# Patient Record
Sex: Female | Born: 1950
Health system: Southern US, Community
[De-identification: ages and names within clinical notes are randomized; demographics above are authoritative.]

## PROBLEM LIST (undated history)

## (undated) DIAGNOSIS — T7840XA Allergy, unspecified, initial encounter: Secondary | ICD-10-CM

## (undated) DIAGNOSIS — R7303 Prediabetes: Secondary | ICD-10-CM

## (undated) DIAGNOSIS — I10 Essential (primary) hypertension: Secondary | ICD-10-CM

## (undated) DIAGNOSIS — E079 Disorder of thyroid, unspecified: Secondary | ICD-10-CM

## (undated) HISTORY — DX: Allergy, unspecified, initial encounter: T78.40XA

## (undated) HISTORY — DX: Disorder of thyroid, unspecified: E07.9

## (undated) HISTORY — PX: MYOMECTOMY: SHX85

## (undated) HISTORY — PX: THYROID SURGERY: SHX805

## (undated) HISTORY — DX: Prediabetes: R73.03

## (undated) HISTORY — DX: Essential (primary) hypertension: I10

---

## 1985-10-12 HISTORY — PX: ABDOMINAL HYSTERECTOMY: SUR658

## 2000-01-21 ENCOUNTER — Encounter: Payer: Self-pay | Admitting: Internal Medicine

## 2000-01-21 ENCOUNTER — Ambulatory Visit (HOSPITAL_COMMUNITY): Admission: RE | Admit: 2000-01-21 | Discharge: 2000-01-21 | Payer: Self-pay | Admitting: Internal Medicine

## 2001-02-22 ENCOUNTER — Ambulatory Visit (HOSPITAL_COMMUNITY): Admission: RE | Admit: 2001-02-22 | Discharge: 2001-02-22 | Payer: Self-pay | Admitting: Internal Medicine

## 2001-02-22 ENCOUNTER — Encounter: Payer: Self-pay | Admitting: Internal Medicine

## 2004-10-10 ENCOUNTER — Ambulatory Visit (HOSPITAL_COMMUNITY): Admission: RE | Admit: 2004-10-10 | Discharge: 2004-10-11 | Payer: Self-pay | Admitting: General Surgery

## 2006-07-06 ENCOUNTER — Ambulatory Visit: Payer: Self-pay | Admitting: Internal Medicine

## 2006-07-20 ENCOUNTER — Ambulatory Visit: Payer: Self-pay | Admitting: Internal Medicine

## 2006-07-20 HISTORY — PX: COLONOSCOPY: SHX174

## 2011-01-26 ENCOUNTER — Encounter: Payer: Self-pay | Admitting: Internal Medicine

## 2011-01-26 DIAGNOSIS — I10 Essential (primary) hypertension: Secondary | ICD-10-CM

## 2011-01-26 DIAGNOSIS — E039 Hypothyroidism, unspecified: Secondary | ICD-10-CM | POA: Insufficient documentation

## 2013-08-16 ENCOUNTER — Ambulatory Visit (INDEPENDENT_AMBULATORY_CARE_PROVIDER_SITE_OTHER): Payer: BC Managed Care – PPO | Admitting: Family Medicine

## 2013-08-16 ENCOUNTER — Encounter: Payer: Self-pay | Admitting: Family Medicine

## 2013-08-16 VITALS — BP 146/71 | HR 80 | Ht 68.0 in | Wt 230.0 lb

## 2013-08-16 DIAGNOSIS — M6530 Trigger finger, unspecified finger: Secondary | ICD-10-CM

## 2013-08-16 DIAGNOSIS — M79609 Pain in unspecified limb: Secondary | ICD-10-CM

## 2013-08-16 DIAGNOSIS — M79644 Pain in right finger(s): Secondary | ICD-10-CM

## 2013-08-16 MED ORDER — MELOXICAM 15 MG PO TABS: 15.0000 mg | ORAL_TABLET | Freq: Every day | ORAL | Status: DC

## 2013-08-16 NOTE — Patient Instructions (Signed)
You have a trigger finger Wear splint at  Nighttime and as often as possible during the day. Meloxicam 15 mg daily with food Consider cortisone injection if not improving. Follow up with me in 6 weeks for reevaluation.

## 2013-08-17 ENCOUNTER — Encounter: Payer: Self-pay | Admitting: Family Medicine

## 2013-08-17 DIAGNOSIS — M79644 Pain in right finger(s): Secondary | ICD-10-CM | POA: Insufficient documentation

## 2013-08-17 NOTE — Progress Notes (Signed)
Patient ID: Kathryn Reed, female   DOB: 06-24-1951, 62 y.o.   MRN: [id number]  PCP: No primary provider on file.  Subjective:   HPI: Patient is a 62 y.o. female here for right middle finger pain.  Patient reports prior history of trigger fingers in the past and this feels similar. Prior ones improved with injection. Current issue been going on 1-2 months. Palmar 3rd digit pain with stiffness especially in morning, swelling. Right handed. Has not tried anything yet for this.  Past Medical History  Diagnosis Date  . Hypertension   . Thyroid disease     Current Outpatient Prescriptions on File Prior to Visit  Medication Sig Dispense Refill  . amLODipine (NORVASC) 5 MG tablet Take 5 mg by mouth daily.        Marland Kitchen levothyroxine (SYNTHROID, LEVOTHROID) 50 MCG tablet Take 50 mcg by mouth daily.        Marland Kitchen losartan-hydrochlorothiazide (HYZAAR) 100-25 MG per tablet Take 1 tablet by mouth daily.         No current facility-administered medications on file prior to visit.    Past Surgical History  Procedure Laterality Date  . Thyroid surgery    . Abdominal hysterectomy      Allergies  Allergen Reactions  . Sulfa Antibiotics   . Asa Arthritis Strength-Antacid [Aspirin Buffered]     History   Social History  . Marital Status: Married    Spouse Name: N/A    Number of Children: N/A  . Years of Education: N/A   Occupational History  . Not on file.   Social History Main Topics  . Smoking status: Former Games developer  . Smokeless tobacco: Not on file  . Alcohol Use: Not on file  . Drug Use: Not on file  . Sexual Activity: Not on file   Other Topics Concern  . Not on file   Social History Narrative  . No narrative on file    Family History  Problem Relation Age of Onset  . Heart attack Mother   . Hypertension Mother   . Heart attack Father   . Hypertension Father   . Heart attack Sister   . Diabetes Sister   . Hypertension Sister   . Hyperlipidemia Brother   . Sudden  death Neg Hx     BP 146/71  Pulse 80  Ht 5\' 8"  (1.727 m)  Wt 230 lb (104.327 kg)  BMI 34.98 kg/m2  Review of Systems: See HPI above.    Objective:  Physical Exam:  Gen: NAD  Right hand: No gross deformity, swelling, bruising, Small nodule A1 pulley 3rd digit with mild tenderness here. No other tenderness hand, 3rd digit. Collateral ligaments intact. 5/5 strength with flexion and extension 3rd MCP, PIP, DIP joints. NVI distally.    Assessment & Plan:  1. Right 3rd digit pain - consistent with flexor tendinitis vs developing trigger finger.  She would like to try meloxicam + splinting - splinted MCP at about 10 degrees with dorsal splint.  Consider injection if not improving as expected.  Icing as needed.  F/u in 6 weeks for reevaluation.

## 2013-08-17 NOTE — Assessment & Plan Note (Signed)
consistent with flexor tendinitis vs developing trigger finger.  She would like to try meloxicam + splinting - splinted MCP at about 10 degrees with dorsal splint.  Consider injection if not improving as expected.  Icing as needed.  F/u in 6 weeks for reevaluation.

## 2013-09-26 ENCOUNTER — Encounter: Payer: Self-pay | Admitting: Family Medicine

## 2013-09-26 ENCOUNTER — Encounter (INDEPENDENT_AMBULATORY_CARE_PROVIDER_SITE_OTHER): Payer: Self-pay

## 2013-09-26 ENCOUNTER — Ambulatory Visit (INDEPENDENT_AMBULATORY_CARE_PROVIDER_SITE_OTHER): Payer: BC Managed Care – PPO | Admitting: Family Medicine

## 2013-09-26 VITALS — BP 120/75 | HR 79 | Ht 68.0 in | Wt 250.0 lb

## 2013-09-26 DIAGNOSIS — M79644 Pain in right finger(s): Secondary | ICD-10-CM

## 2013-09-26 DIAGNOSIS — M79609 Pain in unspecified limb: Secondary | ICD-10-CM

## 2013-09-26 NOTE — Patient Instructions (Signed)
Your exam is much better. I don't feel a nodule here and your motion is good. This is either a resolving trigger finger now or more likely a flexor tendinitis of your third digit. Speak with your family member about occupational therapy measures. If you want to do this in a formal setting call me and we'll set this up. I wouldn't do a cortisone shot based on your exam today. Follow up with me as needed otherwise.

## 2013-09-28 ENCOUNTER — Encounter: Payer: Self-pay | Admitting: Family Medicine

## 2013-09-28 NOTE — Assessment & Plan Note (Signed)
consistent with flexor tendinitis vs developing trigger finger.  She has improved though she still has stiffness and pain.  I would not recommend injection at this time.  Meloxicam as needed.  Discussed considering occupational therapy - has a family member who does this and she will discuss with her.  Call if she wants to try formal OT.

## 2013-09-28 NOTE — Progress Notes (Signed)
Patient ID: Kathryn Reed, female   DOB: July 21, 1951, 62 y.o.   MRN: [id number]  PCP: No primary provider on file.  Subjective:   HPI: Patient is a 62 y.o. female here for right middle finger pain.  11/5: Patient reports prior history of trigger fingers in the past and this feels similar. Prior ones improved with injection. Current issue been going on 1-2 months. Palmar 3rd digit pain with stiffness especially in morning, swelling. Right handed. Has not tried anything yet for this.  12/16: Patient reports she has improved since last visit. Still having pain though in flexor portion of middle finger. No catching or locking. Some swelling. Has been splinting.  Past Medical History  Diagnosis Date  . Hypertension   . Thyroid disease     Current Outpatient Prescriptions on File Prior to Visit  Medication Sig Dispense Refill  . amLODipine (NORVASC) 5 MG tablet Take 5 mg by mouth daily.        Marland Kitchen levothyroxine (SYNTHROID, LEVOTHROID) 50 MCG tablet Take 50 mcg by mouth daily.        Marland Kitchen losartan-hydrochlorothiazide (HYZAAR) 100-25 MG per tablet Take 1 tablet by mouth daily.        . meloxicam (MOBIC) 15 MG tablet Take 1 tablet (15 mg total) by mouth daily.  30 tablet  1   No current facility-administered medications on file prior to visit.    Past Surgical History  Procedure Laterality Date  . Thyroid surgery    . Abdominal hysterectomy      Allergies  Allergen Reactions  . Sulfa Antibiotics   . Asa Arthritis Strength-Antacid [Aspirin Buffered]     History   Social History  . Marital Status: Married    Spouse Name: N/A    Number of Children: N/A  . Years of Education: N/A   Occupational History  . Not on file.   Social History Main Topics  . Smoking status: Former Games developer  . Smokeless tobacco: Not on file  . Alcohol Use: Not on file  . Drug Use: Not on file  . Sexual Activity: Not on file   Other Topics Concern  . Not on file   Social History Narrative  .  No narrative on file    Family History  Problem Relation Age of Onset  . Heart attack Mother   . Hypertension Mother   . Heart attack Father   . Hypertension Father   . Heart attack Sister   . Diabetes Sister   . Hypertension Sister   . Hyperlipidemia Brother   . Sudden death Neg Hx     BP 120/75  Pulse 79  Ht 5\' 8"  (1.727 m)  Wt 250 lb (113.399 kg)  BMI 38.02 kg/m2  Review of Systems: See HPI above.    Objective:  Physical Exam:  Gen: NAD  Right hand: No gross deformity, swelling, bruising, No nodule A1 pulley 3rd digit and no tenderness. No other tenderness hand, 3rd digit. Collateral ligaments intact. 5/5 strength with flexion and extension 3rd MCP, PIP, DIP joints. NVI distally.    Assessment & Plan:  1. Right 3rd digit pain - consistent with flexor tendinitis vs developing trigger finger.  She has improved though she still has stiffness and pain.  I would not recommend injection at this time.  Meloxicam as needed.  Discussed considering occupational therapy - has a family member who does this and she will discuss with her.  Call if she wants to try formal OT.

## 2014-07-19 DIAGNOSIS — H524 Presbyopia: Secondary | ICD-10-CM | POA: Insufficient documentation

## 2014-07-19 DIAGNOSIS — H16229 Keratoconjunctivitis sicca, not specified as Sjogren's, unspecified eye: Secondary | ICD-10-CM | POA: Insufficient documentation

## 2014-07-19 DIAGNOSIS — R87612 Low grade squamous intraepithelial lesion on cytologic smear of cervix (LGSIL): Secondary | ICD-10-CM | POA: Insufficient documentation

## 2014-07-19 DIAGNOSIS — B351 Tinea unguium: Secondary | ICD-10-CM | POA: Insufficient documentation

## 2015-12-18 ENCOUNTER — Other Ambulatory Visit: Payer: Self-pay | Admitting: General Surgery

## 2015-12-18 DIAGNOSIS — E041 Nontoxic single thyroid nodule: Secondary | ICD-10-CM

## 2015-12-24 ENCOUNTER — Other Ambulatory Visit: Payer: Self-pay

## 2015-12-24 ENCOUNTER — Ambulatory Visit
Admission: RE | Admit: 2015-12-24 | Discharge: 2015-12-24 | Disposition: A | Discharge status: Home / Self Care | Payer: Self-pay | Source: Ambulatory Visit | Attending: General Surgery | Admitting: General Surgery

## 2015-12-24 ENCOUNTER — Ambulatory Visit
Admission: RE | Admit: 2015-12-24 | Discharge: 2015-12-24 | Disposition: A | Discharge status: Home / Self Care | Payer: BLUE CROSS/BLUE SHIELD | Source: Ambulatory Visit | Attending: General Surgery | Admitting: General Surgery

## 2015-12-24 ENCOUNTER — Other Ambulatory Visit: Payer: Self-pay | Admitting: General Surgery

## 2015-12-24 ENCOUNTER — Other Ambulatory Visit (HOSPITAL_COMMUNITY)
Admission: RE | Admit: 2015-12-24 | Discharge: 2015-12-24 | Disposition: A | Discharge status: Home / Self Care | Payer: BLUE CROSS/BLUE SHIELD | Source: Ambulatory Visit | Attending: Interventional Radiology | Admitting: Interventional Radiology

## 2015-12-24 DIAGNOSIS — E041 Nontoxic single thyroid nodule: Secondary | ICD-10-CM | POA: Insufficient documentation

## 2016-01-29 ENCOUNTER — Other Ambulatory Visit: Payer: Self-pay | Admitting: General Surgery

## 2016-01-29 DIAGNOSIS — E041 Nontoxic single thyroid nodule: Secondary | ICD-10-CM

## 2016-01-31 ENCOUNTER — Ambulatory Visit
Admission: RE | Admit: 2016-01-31 | Discharge: 2016-01-31 | Disposition: A | Discharge status: Home / Self Care | Payer: BLUE CROSS/BLUE SHIELD | Source: Ambulatory Visit | Attending: General Surgery | Admitting: General Surgery

## 2016-01-31 ENCOUNTER — Other Ambulatory Visit (HOSPITAL_COMMUNITY)
Admission: RE | Admit: 2016-01-31 | Discharge: 2016-01-31 | Disposition: A | Discharge status: Home / Self Care | Payer: BLUE CROSS/BLUE SHIELD | Source: Ambulatory Visit | Attending: Interventional Radiology | Admitting: Interventional Radiology

## 2016-01-31 DIAGNOSIS — E041 Nontoxic single thyroid nodule: Secondary | ICD-10-CM | POA: Diagnosis not present

## 2016-02-13 ENCOUNTER — Ambulatory Visit: Payer: Self-pay | Admitting: General Surgery

## 2016-04-01 ENCOUNTER — Encounter (HOSPITAL_COMMUNITY): Payer: Self-pay

## 2016-04-01 NOTE — Patient Instructions (Addendum)
Kathryn Reed  04/01/2016   Your procedure is scheduled on: 04-09-16  [facility]  [facility] to 3rd floor to  Short Stay Center at 530 AM.  Call this number if you have problems the morning of surgery [phone]   Remember: ONLY 1 PERSON MAY GO WITH YOU TO SHORT STAY TO GET  READY MORNING OF YOUR SURGERY.  Do not eat food or drink liquids :After Midnight.     Take these medicines the morning of surgery with A SIP OF WATER: AMLODIPINE (NORVASC), LEVOTHYROXINE (SYNTHROID), TYLENOL IF NEEDED              You may not have any metal on your body including hair pins and              piercings  Do not wear jewelry, make-up, lotions, powders or perfumes, deodorant             Do not wear nail polish.  Do not shave  48 hours prior to surgery.              Men may shave face and neck.   Do not bring valuables to the hospital. [place] IS NOT             RESPONSIBLE   FOR VALUABLES.  Contacts, dentures or bridgework may not be worn into surgery.  Leave suitcase in the car. After surgery it may be brought to your room.                 Please read over the following fact sheets you were given: _____________________________________________________________________             [facility] - Preparing for Surgery Before surgery, you can play an important role.  Because skin is not sterile, your skin needs to be as free of germs as possible.  You can reduce the number of germs on your skin by washing with CHG (chlorahexidine gluconate) soap before surgery.  CHG is an antiseptic cleaner which kills germs and bonds with the skin to continue killing germs even after washing. Please DO NOT use if you have an allergy to CHG or antibacterial soaps.  If your skin becomes reddened/irritated stop using the CHG and inform your nurse when you arrive at Short Stay. Do not shave (including legs and underarms) for at least 48 hours prior to the  first CHG shower.  You may shave your face/neck. Please follow these instructions carefully:  1.  Shower with CHG Soap the night before surgery and the  morning of Surgery.  2.  If you choose to wash your hair, wash your hair first as usual with your  normal  shampoo.  3.  After you shampoo, rinse your hair and body thoroughly to remove the  shampoo.                           4.  Use CHG as you would any other liquid soap.  You can apply chg directly  to the skin and wash                       Gently with a scrungie or clean washcloth.  5.  Apply the CHG Soap to your body ONLY FROM THE NECK DOWN.   Do not use  on face/ open                           Wound or open sores. Avoid contact with eyes, ears mouth and genitals (private parts).                       Wash face,  Genitals (private parts) with your normal soap.             6.  Wash thoroughly, paying special attention to the area where your surgery  will be performed.  7.  Thoroughly rinse your body with warm water from the neck down.  8.  DO NOT shower/wash with your normal soap after using and rinsing off  the CHG Soap.                9.  Pat yourself dry with a clean towel.            10.  Wear clean pajamas.            11.  Place clean sheets on your bed the night of your first shower and do not  sleep with pets. Day of Surgery : Do not apply any lotions/deodorants the morning of surgery.  Please wear clean clothes to the hospital/surgery center.  FAILURE TO FOLLOW THESE INSTRUCTIONS MAY RESULT IN THE CANCELLATION OF YOUR SURGERY PATIENT SIGNATURE_________________________________  NURSE SIGNATURE__________________________________  ________________________________________________________________________

## 2016-04-03 ENCOUNTER — Encounter (HOSPITAL_COMMUNITY): Payer: Self-pay

## 2016-04-03 ENCOUNTER — Encounter (HOSPITAL_COMMUNITY)
Admission: RE | Admit: 2016-04-03 | Discharge: 2016-04-03 | Disposition: A | Discharge status: Home / Self Care | Payer: BLUE CROSS/BLUE SHIELD | Source: Ambulatory Visit | Attending: General Surgery | Admitting: General Surgery

## 2016-04-03 ENCOUNTER — Ambulatory Visit (HOSPITAL_COMMUNITY)
Admission: RE | Admit: 2016-04-03 | Discharge: 2016-04-03 | Disposition: A | Discharge status: Home / Self Care | Payer: BLUE CROSS/BLUE SHIELD | Source: Ambulatory Visit | Attending: Anesthesiology | Admitting: Anesthesiology

## 2016-04-03 DIAGNOSIS — R935 Abnormal findings on diagnostic imaging of other abdominal regions, including retroperitoneum: Secondary | ICD-10-CM | POA: Insufficient documentation

## 2016-04-03 DIAGNOSIS — Z01811 Encounter for preprocedural respiratory examination: Secondary | ICD-10-CM | POA: Diagnosis present

## 2016-04-03 DIAGNOSIS — Z01818 Encounter for other preprocedural examination: Secondary | ICD-10-CM | POA: Insufficient documentation

## 2016-04-03 LAB — CBC WITH DIFFERENTIAL/PLATELET
BASOS PCT: 0 %
Basophils Absolute: 0 10*3/uL (ref 0.0–0.1)
EOS ABS: 0.2 10*3/uL (ref 0.0–0.7)
EOS PCT: 3 %
HCT: 36.7 % (ref 36.0–46.0)
HEMOGLOBIN: 12.7 g/dL (ref 12.0–15.0)
Lymphocytes Relative: 31 %
Lymphs Abs: 2.5 10*3/uL (ref 0.7–4.0)
MCH: 30.4 pg (ref 26.0–34.0)
MCHC: 34.6 g/dL (ref 30.0–36.0)
MCV: 87.8 fL (ref 78.0–100.0)
MONOS PCT: 6 %
Monocytes Absolute: 0.5 10*3/uL (ref 0.1–1.0)
NEUTROS PCT: 60 %
Neutro Abs: 4.9 10*3/uL (ref 1.7–7.7)
PLATELETS: 368 10*3/uL (ref 150–400)
RBC: 4.18 MIL/uL (ref 3.87–5.11)
RDW: 12.8 % (ref 11.5–15.5)
WBC: 8.1 10*3/uL (ref 4.0–10.5)

## 2016-04-03 LAB — COMPREHENSIVE METABOLIC PANEL
ALBUMIN: 4.3 g/dL (ref 3.5–5.0)
ALK PHOS: 82 U/L (ref 38–126)
ALT: 14 U/L (ref 14–54)
ANION GAP: 7 (ref 5–15)
AST: 15 U/L (ref 15–41)
BUN: 14 mg/dL (ref 6–20)
CHLORIDE: 104 mmol/L (ref 101–111)
CO2: 28 mmol/L (ref 22–32)
Calcium: 9.4 mg/dL (ref 8.9–10.3)
Creatinine, Ser: 0.63 mg/dL (ref 0.44–1.00)
GFR calc non Af Amer: >60 mL/min (ref >60)
GLUCOSE: 105 mg/dL — AB (ref 65–99)
POTASSIUM: 3.3 mmol/L — AB (ref 3.5–5.1)
SODIUM: 139 mmol/L (ref 135–145)
Total Bilirubin: 0.7 mg/dL (ref 0.3–1.2)
Total Protein: 8 g/dL (ref 6.5–8.1)

## 2016-04-03 NOTE — Progress Notes (Signed)
ABNORMAL CHEST XRAY RESULTS ROUTED TO DR Corie ChiquitoOSENBOWER INBASKET BY EPIC

## 2016-04-08 NOTE — Anesthesia Preprocedure Evaluation (Addendum)
Anesthesia Evaluation  Patient identified by MRN, date of birth, ID band Patient awake    Reviewed: Allergy & Precautions, NPO status , Patient's Chart, lab work & pertinent test results  Airway Mallampati: III  TM Distance: >3 FB Neck ROM: Full    Dental  (+) Dental Advisory Given, Partial Upper, Caps   Pulmonary former smoker,    Pulmonary exam normal breath sounds clear to auscultation       Cardiovascular hypertension, Pt. on medications Normal cardiovascular exam Rhythm:Regular Rate:Normal     Neuro/Psych negative neurological ROS  negative psych ROS   GI/Hepatic negative GI ROS, Neg liver ROS,   Endo/Other  Hypothyroidism Morbid obesity  Renal/GU negative Renal ROS     Musculoskeletal negative musculoskeletal ROS (+)   Abdominal   Peds  Hematology negative hematology ROS (+)   Anesthesia Other Findings Day of surgery medications reviewed with the patient.  Reproductive/Obstetrics                           Anesthesia Physical Anesthesia Plan  ASA: III  Anesthesia Plan: General   Post-op Pain Management:    Induction: Intravenous  Airway Management Planned: Oral ETT  Additional Equipment:   Intra-op Plan:   Post-operative Plan: Extubation in OR  Informed Consent: I have reviewed the patients History and Physical, chart, labs and discussed the procedure including the risks, benefits and alternatives for the proposed anesthesia with the patient or authorized representative who has indicated his/her understanding and acceptance.   Dental advisory given  Plan Discussed with: CRNA  Anesthesia Plan Comments: (Risks/benefits of general anesthesia discussed with patient including risk of damage to teeth, lips, gum, and tongue, nausea/vomiting, allergic reactions to medications, and the possibility of heart attack, stroke and death.  All patient questions answered.  Patient wishes  to proceed.)        Anesthesia Quick Evaluation

## 2016-04-09 ENCOUNTER — Ambulatory Visit (HOSPITAL_COMMUNITY)
Admission: RE | Admit: 2016-04-09 | Discharge: 2016-04-10 | Disposition: A | Discharge status: Home / Self Care | Payer: BLUE CROSS/BLUE SHIELD | Source: Ambulatory Visit | Attending: General Surgery | Admitting: General Surgery

## 2016-04-09 ENCOUNTER — Ambulatory Visit (HOSPITAL_COMMUNITY): Payer: BLUE CROSS/BLUE SHIELD | Admitting: Anesthesiology

## 2016-04-09 ENCOUNTER — Encounter (HOSPITAL_COMMUNITY): Payer: Self-pay | Admitting: *Deleted

## 2016-04-09 ENCOUNTER — Encounter (HOSPITAL_COMMUNITY)
Admission: RE | Disposition: A | Discharge status: Home / Self Care | Payer: Self-pay | Source: Ambulatory Visit | Attending: General Surgery

## 2016-04-09 DIAGNOSIS — I10 Essential (primary) hypertension: Secondary | ICD-10-CM | POA: Diagnosis not present

## 2016-04-09 DIAGNOSIS — Z79899 Other long term (current) drug therapy: Secondary | ICD-10-CM | POA: Insufficient documentation

## 2016-04-09 DIAGNOSIS — E041 Nontoxic single thyroid nodule: Secondary | ICD-10-CM | POA: Diagnosis present

## 2016-04-09 DIAGNOSIS — E063 Autoimmune thyroiditis: Secondary | ICD-10-CM | POA: Insufficient documentation

## 2016-04-09 DIAGNOSIS — Z87891 Personal history of nicotine dependence: Secondary | ICD-10-CM | POA: Insufficient documentation

## 2016-04-09 DIAGNOSIS — R1901 Right upper quadrant abdominal swelling, mass and lump: Secondary | ICD-10-CM

## 2016-04-09 DIAGNOSIS — Z6841 Body Mass Index (BMI) 40.0 and over, adult: Secondary | ICD-10-CM | POA: Diagnosis not present

## 2016-04-09 HISTORY — PX: THYROIDECTOMY: SHX17

## 2016-04-09 LAB — BASIC METABOLIC PANEL
Anion gap: 8 (ref 5–15)
BUN: 11 mg/dL (ref 6–20)
CO2: 30 mmol/L (ref 22–32)
CREATININE: 0.71 mg/dL (ref 0.44–1.00)
Calcium: 9.1 mg/dL (ref 8.9–10.3)
Chloride: 100 mmol/L — ABNORMAL LOW (ref 101–111)
GFR calc Af Amer: >60 mL/min (ref >60)
Glucose, Bld: 140 mg/dL — ABNORMAL HIGH (ref 65–99)
POTASSIUM: 3.8 mmol/L (ref 3.5–5.1)
SODIUM: 138 mmol/L (ref 135–145)

## 2016-04-09 SURGERY — THYROIDECTOMY
Anesthesia: General | Site: Neck | Laterality: Right

## 2016-04-09 MED ORDER — ONDANSETRON 4 MG PO TBDP: 4.0000 mg | ORAL_TABLET | Freq: Four times a day (QID) | ORAL | Status: DC | PRN

## 2016-04-09 MED ORDER — LOSARTAN POTASSIUM 50 MG PO TABS
100.0000 mg | ORAL_TABLET | Freq: Every day | ORAL | Status: DC
  Administered 2016-04-09 – 2016-04-10 (×2): 100 mg via ORAL
  Filled 2016-04-09 (×2): qty 2

## 2016-04-09 MED ORDER — ONDANSETRON HCL 4 MG/2ML IJ SOLN
INTRAMUSCULAR | Status: DC | PRN
  Administered 2016-04-09: 4 mg via INTRAVENOUS

## 2016-04-09 MED ORDER — ONDANSETRON HCL 4 MG/2ML IJ SOLN
INTRAMUSCULAR | Status: AC
  Filled 2016-04-09: qty 2

## 2016-04-09 MED ORDER — LOSARTAN POTASSIUM-HCTZ 100-25 MG PO TABS: 1.0000 | ORAL_TABLET | Freq: Every day | ORAL | Status: DC

## 2016-04-09 MED ORDER — CEFAZOLIN SODIUM-DEXTROSE 2-4 GM/100ML-% IV SOLN
INTRAVENOUS | Status: AC
  Filled 2016-04-09: qty 100

## 2016-04-09 MED ORDER — CEFAZOLIN SODIUM-DEXTROSE 2-4 GM/100ML-% IV SOLN
2.0000 g | INTRAVENOUS | Status: AC
  Administered 2016-04-09: 2 g via INTRAVENOUS

## 2016-04-09 MED ORDER — HYDROCODONE-ACETAMINOPHEN 5-325 MG PO TABS
1.0000 | ORAL_TABLET | ORAL | Status: DC | PRN
  Administered 2016-04-09: 1 via ORAL
  Filled 2016-04-09: qty 1

## 2016-04-09 MED ORDER — LIDOCAINE HCL (CARDIAC) 20 MG/ML IV SOLN
INTRAVENOUS | Status: DC | PRN
  Administered 2016-04-09: 100 mg via INTRAVENOUS

## 2016-04-09 MED ORDER — SUCCINYLCHOLINE CHLORIDE 20 MG/ML IJ SOLN
INTRAMUSCULAR | Status: DC | PRN
Start: 2016-04-09 — End: 2016-04-09
  Administered 2016-04-09: 100 mg via INTRAVENOUS

## 2016-04-09 MED ORDER — MIDAZOLAM HCL 2 MG/2ML IJ SOLN
INTRAMUSCULAR | Status: AC
  Filled 2016-04-09: qty 2

## 2016-04-09 MED ORDER — ONDANSETRON HCL 4 MG/2ML IJ SOLN: 4.0000 mg | INTRAMUSCULAR | Status: DC | PRN

## 2016-04-09 MED ORDER — PROPOFOL 10 MG/ML IV BOLUS
INTRAVENOUS | Status: DC | PRN
  Administered 2016-04-09: 200 mg via INTRAVENOUS

## 2016-04-09 MED ORDER — HYDROMORPHONE HCL 1 MG/ML IJ SOLN
0.2500 mg | INTRAMUSCULAR | Status: DC | PRN
  Administered 2016-04-09 (×2): 0.5 mg via INTRAVENOUS

## 2016-04-09 MED ORDER — HYDROMORPHONE HCL 1 MG/ML IJ SOLN
INTRAMUSCULAR | Status: AC
  Filled 2016-04-09: qty 1

## 2016-04-09 MED ORDER — LACTATED RINGERS IV SOLN
INTRAVENOUS | Status: DC | PRN
  Administered 2016-04-09 (×2): via INTRAVENOUS

## 2016-04-09 MED ORDER — LIDOCAINE HCL (CARDIAC) 20 MG/ML IV SOLN
INTRAVENOUS | Status: AC
  Filled 2016-04-09: qty 5

## 2016-04-09 MED ORDER — MIDAZOLAM HCL 5 MG/5ML IJ SOLN
INTRAMUSCULAR | Status: DC | PRN
  Administered 2016-04-09: 2 mg via INTRAVENOUS

## 2016-04-09 MED ORDER — FENTANYL CITRATE (PF) 250 MCG/5ML IJ SOLN
INTRAMUSCULAR | Status: AC
Start: 2016-04-09 — End: 2016-04-09
  Filled 2016-04-09: qty 5

## 2016-04-09 MED ORDER — CEFAZOLIN SODIUM-DEXTROSE 2-4 GM/100ML-% IV SOLN
2.0000 g | Freq: Three times a day (TID) | INTRAVENOUS | Status: AC
  Administered 2016-04-09: 2 g via INTRAVENOUS
  Filled 2016-04-09: qty 100

## 2016-04-09 MED ORDER — SCOPOLAMINE 1 MG/3DAYS TD PT72
MEDICATED_PATCH | TRANSDERMAL | Status: AC
  Filled 2016-04-09: qty 1

## 2016-04-09 MED ORDER — PROPOFOL 10 MG/ML IV BOLUS
INTRAVENOUS | Status: AC
  Filled 2016-04-09: qty 40

## 2016-04-09 MED ORDER — LEVOTHYROXINE SODIUM 100 MCG PO TABS
100.0000 ug | ORAL_TABLET | Freq: Every day | ORAL | Status: DC
  Administered 2016-04-10: 100 ug via ORAL
  Filled 2016-04-09: qty 1

## 2016-04-09 MED ORDER — MORPHINE SULFATE (PF) 2 MG/ML IV SOLN: 2.0000 mg | INTRAVENOUS | Status: DC | PRN

## 2016-04-09 MED ORDER — PROMETHAZINE HCL 25 MG/ML IJ SOLN: 6.2500 mg | INTRAMUSCULAR | Status: DC | PRN

## 2016-04-09 MED ORDER — PHENYLEPHRINE HCL 10 MG/ML IJ SOLN
INTRAMUSCULAR | Status: DC | PRN
Start: 2016-04-09 — End: 2016-04-09
  Administered 2016-04-09 (×4): 80 ug via INTRAVENOUS

## 2016-04-09 MED ORDER — FENTANYL CITRATE (PF) 100 MCG/2ML IJ SOLN
INTRAMUSCULAR | Status: DC | PRN
  Administered 2016-04-09 (×2): 100 ug via INTRAVENOUS

## 2016-04-09 MED ORDER — ROCURONIUM BROMIDE 100 MG/10ML IV SOLN
INTRAVENOUS | Status: DC | PRN
  Administered 2016-04-09: 50 mg via INTRAVENOUS

## 2016-04-09 MED ORDER — SCOPOLAMINE 1 MG/3DAYS TD PT72
1.0000 | MEDICATED_PATCH | TRANSDERMAL | Status: DC
  Administered 2016-04-09: 1 via TRANSDERMAL

## 2016-04-09 MED ORDER — HYDROCHLOROTHIAZIDE 25 MG PO TABS
25.0000 mg | ORAL_TABLET | Freq: Every day | ORAL | Status: DC
Start: 2016-04-09 — End: 2016-04-10
  Administered 2016-04-09 – 2016-04-10 (×2): 25 mg via ORAL
  Filled 2016-04-09 (×2): qty 1

## 2016-04-09 MED ORDER — SUGAMMADEX SODIUM 200 MG/2ML IV SOLN
INTRAVENOUS | Status: DC | PRN
  Administered 2016-04-09: 200 mg via INTRAVENOUS

## 2016-04-09 MED ORDER — 0.9 % SODIUM CHLORIDE (POUR BTL) OPTIME
TOPICAL | Status: DC | PRN
  Administered 2016-04-09: 1000 mL

## 2016-04-09 MED ORDER — AMLODIPINE BESYLATE 5 MG PO TABS
5.0000 mg | ORAL_TABLET | Freq: Every day | ORAL | Status: DC
  Administered 2016-04-10: 5 mg via ORAL
  Filled 2016-04-09: qty 1

## 2016-04-09 SURGICAL SUPPLY — 46 items
ATTRACTOMAT 16X20 MAGNETIC DRP (DRAPES) ×2 IMPLANT
BENZOIN TINCTURE PRP APPL 2/3 (GAUZE/BANDAGES/DRESSINGS) ×2 IMPLANT
BLADE HEX COATED 2.75 (ELECTRODE) ×2 IMPLANT
BLADE SURG 15 STRL LF DISP TIS (BLADE) ×2 IMPLANT
BLADE SURG 15 STRL SS (BLADE) ×2
CLIP TI MEDIUM 6 (CLIP) ×4 IMPLANT
CLIP TI WIDE RED SMALL 6 (CLIP) ×6 IMPLANT
COVER SURGICAL LIGHT HANDLE (MISCELLANEOUS) IMPLANT
DISSECTOR ROUND CHERRY 3/8 STR (MISCELLANEOUS) ×2 IMPLANT
DRAIN PENROSE 18X1/4 LTX STRL (WOUND CARE) IMPLANT
DRAPE LAPAROTOMY T 98X78 PEDS (DRAPES) ×2 IMPLANT
DRESSING SURGICEL FIBRLLR 1X2 (HEMOSTASIS) ×1 IMPLANT
DRSG SURGICEL FIBRILLAR 1X2 (HEMOSTASIS) ×2
ELECT PENCIL ROCKER SW 15FT (MISCELLANEOUS) ×2 IMPLANT
ELECT REM PT RETURN 9FT ADLT (ELECTROSURGICAL) ×2
ELECTRODE REM PT RTRN 9FT ADLT (ELECTROSURGICAL) ×1 IMPLANT
GAUZE SPONGE 4X4 12PLY STRL (GAUZE/BANDAGES/DRESSINGS) ×2 IMPLANT
GAUZE SPONGE 4X4 16PLY XRAY LF (GAUZE/BANDAGES/DRESSINGS) ×4 IMPLANT
GLOVE BIOGEL PI IND STRL 6.5 (GLOVE) ×1 IMPLANT
GLOVE BIOGEL PI IND STRL 7.0 (GLOVE) ×1 IMPLANT
GLOVE BIOGEL PI INDICATOR 6.5 (GLOVE) ×1
GLOVE BIOGEL PI INDICATOR 7.0 (GLOVE) ×1
GLOVE ECLIPSE 8.0 STRL XLNG CF (GLOVE) ×2 IMPLANT
GLOVE INDICATOR 8.0 STRL GRN (GLOVE) ×4 IMPLANT
GLOVE SURG SS PI 6.5 STRL IVOR (GLOVE) ×2 IMPLANT
GOWN STRL REUS W/TWL LRG LVL3 (GOWN DISPOSABLE) ×4 IMPLANT
GOWN STRL REUS W/TWL XL LVL3 (GOWN DISPOSABLE) ×4 IMPLANT
HEMOSTAT SURGICEL 2X14 (HEMOSTASIS) ×2 IMPLANT
KIT BASIN OR (CUSTOM PROCEDURE TRAY) ×2 IMPLANT
NS IRRIG 1000ML POUR BTL (IV SOLUTION) ×2 IMPLANT
PACK BASIC VI WITH GOWN DISP (CUSTOM PROCEDURE TRAY) ×2 IMPLANT
SHEARS HARMONIC 9CM CVD (BLADE) IMPLANT
STAPLER VISISTAT 35W (STAPLE) ×2 IMPLANT
STRIP CLOSURE SKIN 1/2X4 (GAUZE/BANDAGES/DRESSINGS) ×2 IMPLANT
SUT MNCRL AB 4-0 PS2 18 (SUTURE) ×2 IMPLANT
SUT SILK 2 0 (SUTURE)
SUT SILK 2-0 18XBRD TIE 12 (SUTURE) IMPLANT
SUT SILK 3 0 (SUTURE)
SUT SILK 3-0 18XBRD TIE 12 (SUTURE) IMPLANT
SUT VIC AB 3-0 SH 18 (SUTURE) ×2 IMPLANT
SUT VICRYL 3 0 BR 18  UND (SUTURE)
SUT VICRYL 3 0 BR 18 UND (SUTURE) IMPLANT
SYR BULB IRRIGATION 50ML (SYRINGE) ×2 IMPLANT
TAPE CLOTH SURG 4X10 WHT LF (GAUZE/BANDAGES/DRESSINGS) ×2 IMPLANT
TOWEL OR 17X26 10 PK STRL BLUE (TOWEL DISPOSABLE) ×2 IMPLANT
YANKAUER SUCT BULB TIP 10FT TU (MISCELLANEOUS) ×2 IMPLANT

## 2016-04-09 NOTE — Discharge Instructions (Signed)
[facility]      [facility], [place] [phone]  THYROID/ PARATHYROID SURGERY: POST OP INSTRUCTIONS  Always review your discharge instruction sheet given to you by the facility where your surgery was performed.  IF YOU HAVE DISABILITY OR FAMILY LEAVE FORMS, YOU MUST BRING THEM TO THE OFFICE FOR PROCESSING.  PLEASE DO NOT GIVE THEM TO YOUR DOCTOR.  1. A prescription for pain medication may be given to you upon discharge.  Take your pain medication as prescribed, if needed.  If narcotic pain medicine is not needed, then you may take acetaminophen (Tylenol) or ibuprofen (Advil) as needed. 2. Take your usually prescribed medications unless otherwise directed. 3. If you need a refill on your pain medication, please contact your pharmacy. They will contact our office to request authorization.  Prescriptions will not be filled after 5pm or on week-ends. 4. You should follow a soft diet.  Be sure to include lots of fluids daily.  Resume your normal diet the day after surgery. 5. Most patients will experience some swelling and bruising on the chest and neck area.  Ice packs will help.  Swelling and bruising can take several days to resolve.  6. It is common to experience some constipation if taking pain medication after surgery.  Increasing fluid intake and taking a stool softener will usually help or prevent this problem from occurring.  A mild laxative (Milk of Magnesia or Miralax) should be taken according to package directions if there are no bowel movements after 48 hours. 7. Unless discharge instructions indicate otherwise, you may remove your bandages 48 hours after surgery, and you may shower at that time.  You may have steri-strips (small skin tapes) in place directly over the incision.  These strips should be left on the skin.  If your surgeon used skin glue on the incision, you may shower in 24 hours.  The glue will flake off over the next 2-3 weeks.  Any sutures or staples will be removed at the  office during your follow-up visit. 8. ACTIVITIES:  You may resume regular (light) daily activities beginning the next day--such as daily self-care, walking, climbing stairs--gradually increasing activities as tolerated.  You may have sexual intercourse when it is comfortable.  Refrain from any heavy lifting or straining for at least one week.  a. You may drive when you no longer are taking prescription pain medication, you can comfortably wear a seatbelt, and you can safely maneuver your car and apply brakes b. RETURN TO WORK:  __________________________________________________________ 9. You should see your doctor in the office for a follow-up appointment approximately two weeks after your surgery.  Make sure that you call for this appointment within a day or two after you arrive home to insure a convenient appointment time. 10. OTHER INSTRUCTIONS: ____________________________________________________________________________ _________________________________________________________________________________________________________________ _________________________________________________________________________________________________________________   WHEN TO CALL YOUR DOCTOR: 1. Fever over 101.0 2. Inability to urinate 3. Nausea and/or vomiting 4. Extreme swelling or bruising 5. Continued bleeding from incision. 6. Increased pain, redness, or drainage from the incision. 7. Difficulty swallowing or breathing 8. Muscle cramping or spasms. 9. Numbness or tingling in hands or feet or around lips.  The clinic staff is available to answer your questions during regular business hours.  Please dont hesitate to call and ask to speak to one of the nurses if you have concerns.  For further questions, please visit [email or web address]

## 2016-04-09 NOTE — Anesthesia Procedure Notes (Signed)
Procedure Name: Intubation Performed by: Kaymarie Wynn J Pre-anesthesia Checklist: Patient identified, Emergency Drugs available, Suction available, Patient being monitored and Timeout performed Patient Re-evaluated:Patient Re-evaluated prior to inductionOxygen Delivery Method: Circle system utilized Preoxygenation: Pre-oxygenation with 100% oxygen Intubation Type: IV induction Ventilation: Mask ventilation without difficulty Laryngoscope Size: Mac and 4 Grade View: Grade II Tube type: Oral Tube size: 7.0 mm Number of attempts: 1 Airway Equipment and Method: Stylet Placement Confirmation: ETT inserted through vocal cords under direct vision,  positive ETCO2,  CO2 detector and breath sounds checked- equal and bilateral Secured at: 21 cm Tube secured with: Tape Dental Injury: Teeth and Oropharynx as per pre-operative assessment        

## 2016-04-09 NOTE — Op Note (Signed)
Operative Note  [name] female 65 y.o. 04/09/2016  PREOPERATIVE DX:  Enlarging right thyroid follicular nodule  POSTOPERATIVE DX:  Same  PROCEDURE:   Completion thyroidectomy (right thyroid lobe and isthmus)         Surgeon: Adolph PollackOSENBOWER,Eluzer Howdeshell J   Assistants: Darnell Levelodd Gerkin M.D.  Anesthesia: General endotracheal anesthesia  Indications:   This is a 65 year old female who had a left thyroid lobectomy in the distant past for what turned out to be benign disease. She now has an enlarging follicular nodule despite suppressive therapy. She presents for the above operation. We have discussed the procedure, risks, and aftercare preoperatively.    Procedure Detail:  She was brought to the operating room placed supine on the operating table and a general anesthetic was given. The neck was placed in slight extension. The neck and upper chest wall were sterilely prepped and draped. A timeout was performed.  I made an incision through a previous lower transverse scar and extended it a little to the right. The subcutaneous tissue and platysma muscle were divided. Subplatysmal flaps were then raised superiorly to the laryngeal prominence and inferiorly to the suprasternal notch.  Strap muscles were identified and the median raphae divided. Right strap muscles were dissected free from the right lobe of the thyroid gland and isthmus.  The superior pole of the thyroid gland was identified. Dissection proceeded staying on the thyroid capsule.  The superior pole vessels were isolated and clipped and divided mobilizing the superior pole. Middle thyroid veins were divided close to the thyroid gland. The anterior pole was then mobilized and the inferior parathyroid gland identified and preserved. Small inferior pole vessels were clipped and divided with the Harmonic scalpel. I then used electrocautery to mobilize the isthmus. The superior parathyroid gland was isolated and preserved. The right recurrent  laryngeal nerve is identified. The thyroid gland was then dissected free from this area and rotated medially. The thyroid nodule was identified in the middle and inferior portions.  Small vessels were clipped and divided. The right lobe of the thyroid gland and then isthmus were then dissected free from the trachea. The recurrent nerve and both parathyroid glands were intact. Specimen was handed off the field and sent to pathology.  The wound was inspected and bleeding points on the trachea controlled electrocautery. Hemostasis was adequate at this time. Surgicel was placed into the wound. The strap muscles were approximated with interrupted 3-0 Vicryl sutures. The platysma muscle was approximated with interrupted 3-0 Vicryl sutures. The skin was closed with a running 4-0 Monocryl subcuticular stitch. Steri-Strips and a sterile dressing were applied.  She tolerated the procedure well without any apparent complications and was taken to the recovery room in satisfactory condition.  Estimated Blood Loss:  150 cc                 Specimens: Right lobe of thyroid gland and isthmus        Complications:  * No complications entered in OR log *         Disposition: PACU - hemodynamically stable.         Condition: stable

## 2016-04-09 NOTE — H&P (Signed)
Kathryn Reed is an 65 y.o. female.   Chief Complaint:  Here for elective surgery. HPI:  She has a enlarging follicular nodule of the right lobe of the thyroid gland despite suppressive therapy.  She has had a previous left thyroid lobectomy.  She now presents for elective right thyroid lobectomy.  Past Medical History  Diagnosis Date  . Hypertension   . Thyroid disease     Past Surgical History  Procedure Laterality Date  . Thyroid surgery  15 YRS AGO    LEFT SIDE  . Abdominal hysterectomy  1987    1 OVARY REMOVED    Family History  Problem Relation Age of Onset  . Heart attack Mother   . Hypertension Mother   . Heart attack Father   . Hypertension Father   . Heart attack Sister   . Diabetes Sister   . Hypertension Sister   . Hyperlipidemia Brother   . Sudden death Neg Hx    Social History:  reports that she quit smoking about 20 years ago. Her smoking use included Cigarettes. She has a 5 pack-year smoking history. She has never used smokeless tobacco. She reports that she does not drink alcohol or use illicit drugs.  Allergies:  Allergies  Allergen Reactions  . Sulfa Antibiotics     RASH    Medications Prior to Admission  Medication Sig Dispense Refill  . acetaminophen (TYLENOL) 325 MG tablet Take 650 mg by mouth every 6 (six) hours as needed.    Marland Kitchen. amLODipine (NORVASC) 5 MG tablet Take 5 mg by mouth daily.      . cholecalciferol (VITAMIN D) 1000 units tablet Take 3,000 Units by mouth daily.    Marland Kitchen. levothyroxine (SYNTHROID, LEVOTHROID) 50 MCG tablet Take 50 mcg by mouth daily.      Marland Kitchen. losartan-hydrochlorothiazide (HYZAAR) 100-25 MG per tablet Take 1 tablet by mouth daily.        No results found for this or any previous visit (from the past 48 hour(s)). No results found.  ROS  Pulse 85, temperature 98.2 F (36.8 C), temperature source Oral, resp. rate 18, height 5\' 7"  (1.702 m), weight 119.098 kg (262 lb 9 oz), SpO2 100 %. Physical Exam  Constitutional: No  distress.  Obese female.  HENT:  Head: Normocephalic and atraumatic.  No hoarseness  Neck:  Lower transverse scar  Palpable right lower neck mass  Cardiovascular: Normal rate and regular rhythm.   Respiratory: Effort normal and breath sounds normal.  GI: Soft. There is no tenderness.  Neurological: She is alert.  Psychiatric: She has a normal mood and affect. Her behavior is normal.     Assessment/Plan Enlarging follicular right thyroid nodule despite suppressive therapy  Plan:  Right thyroid lobectomy  Ami Thornsberry J, MD 04/09/2016, 7:16 AM

## 2016-04-09 NOTE — Anesthesia Postprocedure Evaluation (Signed)
Anesthesia Post Note  Patient: Kathryn Reed  Procedure(s) Performed: Procedure(s) (LRB): COMPLETION THYROIDECTOMY (Right)  Patient location during evaluation: PACU Anesthesia Type: General Level of consciousness: sedated Pain management: pain level controlled Vital Signs Assessment: post-procedure vital signs reviewed and stable Respiratory status: spontaneous breathing and respiratory function stable Cardiovascular status: stable Anesthetic complications: no    Last Vitals:  Filed Vitals:   04/09/16 1027 04/09/16 1133  BP: 133/69 139/65  Pulse: 68 59  Temp: 36.6 C   Resp: 15 16    Last Pain:  Filed Vitals:   04/09/16 1134  PainSc: 2                  Brynnley Dayrit DANIEL

## 2016-04-09 NOTE — Transfer of Care (Signed)
Immediate Anesthesia Transfer of Care Note  Patient: Kathryn Reed  Procedure(s) Performed: Procedure(s): COMPLETION THYROIDECTOMY (Right)  Patient Location: PACU  Anesthesia Type:General  Level of Consciousness: awake, alert  and oriented  Airway & Oxygen Therapy: Patient Spontanous Breathing and Patient connected to face mask oxygen  Post-op Assessment: Report given to RN and Post -op Vital signs reviewed and stable  Post vital signs: Reviewed and stable  Last Vitals:  Filed Vitals:   04/09/16 0541  Pulse: 85  Temp: 36.8 C  Resp: 18    Last Pain: There were no vitals filed for this visit.       Complications: No apparent anesthesia complications

## 2016-04-10 ENCOUNTER — Ambulatory Visit (HOSPITAL_COMMUNITY): Payer: BLUE CROSS/BLUE SHIELD

## 2016-04-10 DIAGNOSIS — E041 Nontoxic single thyroid nodule: Secondary | ICD-10-CM | POA: Diagnosis not present

## 2016-04-10 LAB — BASIC METABOLIC PANEL
ANION GAP: 7 (ref 5–15)
BUN: 10 mg/dL (ref 6–20)
CHLORIDE: 101 mmol/L (ref 101–111)
CO2: 29 mmol/L (ref 22–32)
CREATININE: 0.69 mg/dL (ref 0.44–1.00)
Calcium: 9.1 mg/dL (ref 8.9–10.3)
GFR calc non Af Amer: >60 mL/min (ref >60)
GLUCOSE: 139 mg/dL — AB (ref 65–99)
Potassium: 3.3 mmol/L — ABNORMAL LOW (ref 3.5–5.1)
Sodium: 137 mmol/L (ref 135–145)

## 2016-04-10 MED ORDER — LEVOTHYROXINE SODIUM 100 MCG PO TABS: 100.0000 ug | ORAL_TABLET | Freq: Every day | ORAL | Status: DC

## 2016-04-10 MED ORDER — HYDROCODONE-ACETAMINOPHEN 5-325 MG PO TABS: 1.0000 | ORAL_TABLET | ORAL | Status: DC | PRN

## 2016-04-10 NOTE — Progress Notes (Signed)
Went over d/c instructions with patient.  She verbalized understanding.  Left hospital with husband via w/c and personal vehicle with hard scripts.  Levora AngelHannah V Catilyn Boggus, RN

## 2016-04-10 NOTE — Progress Notes (Signed)
1 Day Post-Op  Subjective: Minimal pain.  Swallowing well.  Objective: Vital signs in last 24 hours: Temp:  [97.6 F (36.4 C)-98.6 F (37 C)] 98.6 F (37 C) (06/30 0656) Pulse Rate:  [59-68] 62 (06/30 0656) Resp:  [15-18] 18 (06/30 0656) BP: (133-139)/(63-69) 138/64 mmHg (06/30 0656) SpO2:  [100 %] 100 % (06/30 0656)    Intake/Output from previous day: 06/29 0701 - 06/30 0700 In: 1500 [I.V.:1500] Out: 50 [Blood:50] Intake/Output this shift: Total I/O In: 240 [P.O.:240] Out: -   PE: General- In NAD Neck-incision clean and intact with minimal swelling  Lab Results:  No results for input(s): WBC, HGB, HCT, PLT in the last 72 hours. BMET  Recent Labs  04/09/16 1718 04/10/16 0549  NA 138 137  K 3.8 3.3*  CL 100* 101  CO2 30 29  GLUCOSE 140* 139*  BUN 11 10  CREATININE 0.71 0.69  CALCIUM 9.1 9.1   PT/INR No results for input(s): LABPROT, INR in the last 72 hours. Comprehensive Metabolic Panel:    Component Value Date/Time   NA 137 04/10/2016 0549   NA 138 04/09/2016 1718   K 3.3* 04/10/2016 0549   K 3.8 04/09/2016 1718   CL 101 04/10/2016 0549   CL 100* 04/09/2016 1718   CO2 29 04/10/2016 0549   CO2 30 04/09/2016 1718   BUN 10 04/10/2016 0549   BUN 11 04/09/2016 1718   CREATININE 0.69 04/10/2016 0549   CREATININE 0.71 04/09/2016 1718   GLUCOSE 139* 04/10/2016 0549   GLUCOSE 140* 04/09/2016 1718   CALCIUM 9.1 04/10/2016 0549   CALCIUM 9.1 04/09/2016 1718   AST 15 04/03/2016 1430   ALT 14 04/03/2016 1430   ALKPHOS 82 04/03/2016 1430   BILITOT 0.7 04/03/2016 1430   PROT 8.0 04/03/2016 1430   ALBUMIN 4.3 04/03/2016 1430     Studies/Results: No results found.  Anti-infectives: Anti-infectives    Start     Dose/Rate Route Frequency Ordered Stop   04/09/16 1400  ceFAZolin (ANCEF) IVPB 2g/100 mL premix     2 g 200 mL/hr over 30 Minutes Intravenous Every 8 hours 04/09/16 1020 04/09/16 1614   04/09/16 0542  ceFAZolin (ANCEF) IVPB 2g/100 mL premix      2 g 200 mL/hr over 30 Minutes Intravenous On call to O.R. 04/09/16 0542 04/09/16 0746      Assessment Active Problems:  Enlarging follicular nodule right thyroid lobe s/p completion thyroidectomy 04/09/16-doing well; Calcium level normal and stable  RUQ abdominal opacity on CXR-she has no RUQ pain; plain abdominal x-rays recommended by Radiologist for further evaluation.      Plan: Abdominal x-rays today.  Further evaluation as outpatient if needed based on x-ray results.  Discharge today.  Instructions given to her.   Raesha Coonrod J 04/10/2016

## 2016-05-18 ENCOUNTER — Encounter: Payer: Self-pay | Admitting: Gastroenterology

## 2016-08-31 ENCOUNTER — Encounter: Payer: Self-pay | Admitting: Gastroenterology

## 2016-09-22 ENCOUNTER — Ambulatory Visit (AMBULATORY_SURGERY_CENTER): Payer: Self-pay | Admitting: *Deleted

## 2016-09-22 VITALS — Ht 67.0 in | Wt 264.0 lb

## 2016-09-22 DIAGNOSIS — Z1211 Encounter for screening for malignant neoplasm of colon: Secondary | ICD-10-CM

## 2016-09-22 MED ORDER — NA SULFATE-K SULFATE-MG SULF 17.5-3.13-1.6 GM/177ML PO SOLN: 1.0000 | Freq: Once | ORAL | 0 refills | Status: AC

## 2016-09-22 NOTE — Progress Notes (Signed)
No egg or soy allergy known to patient  No issues with past sedation with any surgeries  or procedures, no intubation problems  No diet pills per patient No home 02 use per patient  No blood thinners per patient  Pt denies issues with constipation  No A fib or A flutter  emmi video to e mail    

## 2016-09-23 ENCOUNTER — Encounter: Payer: Self-pay | Admitting: Gastroenterology

## 2016-10-01 ENCOUNTER — Telehealth: Payer: Self-pay | Admitting: Gastroenterology

## 2016-10-01 NOTE — Telephone Encounter (Signed)
Patient calling back regarding this. She is requesting a callback on her cell phone

## 2016-10-02 NOTE — Telephone Encounter (Signed)
We have not received any paperwork from pharmacy, Called patient and offered her a free Suprep kit she will be here today before 5 to pick it up

## 2016-10-07 ENCOUNTER — Ambulatory Visit (AMBULATORY_SURGERY_CENTER): Payer: BLUE CROSS/BLUE SHIELD | Admitting: Gastroenterology

## 2016-10-07 ENCOUNTER — Encounter: Payer: Self-pay | Admitting: Gastroenterology

## 2016-10-07 VITALS — BP 143/73 | HR 69 | Temp 97.7°F | Resp 14 | Ht 67.0 in | Wt 264.0 lb

## 2016-10-07 DIAGNOSIS — Z1212 Encounter for screening for malignant neoplasm of rectum: Secondary | ICD-10-CM

## 2016-10-07 DIAGNOSIS — Z1211 Encounter for screening for malignant neoplasm of colon: Secondary | ICD-10-CM

## 2016-10-07 MED ORDER — SODIUM CHLORIDE 0.9 % IV SOLN: 500.0000 mL | INTRAVENOUS | Status: AC

## 2016-10-07 NOTE — Patient Instructions (Signed)
YOU HAD AN ENDOSCOPIC PROCEDURE TODAY AT [facility]:   Refer to the procedure report that was given to you for any specific questions about what was found during the examination.  If the procedure report does not answer your questions, please call your gastroenterologist to clarify.  If you requested that your care partner not be given the details of your procedure findings, then the procedure report has been included in a sealed envelope for you to review at your convenience later.  YOU SHOULD EXPECT: Some feelings of bloating in the abdomen. Passage of more gas than usual.  Walking can help get rid of the air that was put into your GI tract during the procedure and reduce the bloating. If you had a lower endoscopy (such as a colonoscopy or flexible sigmoidoscopy) you may notice spotting of blood in your stool or on the toilet paper. If you underwent a bowel prep for your procedure, you may not have a normal bowel movement for a few days.  Please Note:  You might notice some irritation and congestion in your nose or some drainage.  This is from the oxygen used during your procedure.  There is no need for concern and it should clear up in a day or so.  SYMPTOMS TO REPORT IMMEDIATELY:   Following lower endoscopy (colonoscopy or flexible sigmoidoscopy):  Excessive amounts of blood in the stool  Significant tenderness or worsening of abdominal pains  Swelling of the abdomen that is new, acute  Fever of 100F or higher   Following upper endoscopy (EGD)  Vomiting of blood or coffee ground material  New chest pain or pain under the shoulder blades  Painful or persistently difficult swallowing  New shortness of breath  Fever of 100F or higher  Black, tarry-looking stools  For urgent or emergent issues, a gastroenterologist can be reached at any hour by calling [phone].   DIET:  We do recommend a small meal at first, but then you may proceed to your regular diet.  Drink  plenty of fluids but you should avoid alcoholic beverages for 24 hours.  ACTIVITY:  You should plan to take it easy for the rest of today and you should NOT DRIVE or use heavy machinery until tomorrow (because of the sedation medicines used during the test).    FOLLOW UP: Our staff will call the number listed on your records the next business day following your procedure to check on you and address any questions or concerns that you may have regarding the information given to you following your procedure. If we do not reach you, we will leave a message.  However, if you are feeling well and you are not experiencing any problems, there is no need to return our call.  We will assume that you have returned to your regular daily activities without incident.  If any biopsies were taken you will be contacted by phone or by letter within the next 1-3 weeks.  Please call us at [phone] if you have not heard about the biopsies in 3 weeks.    SIGNATURES/CONFIDENTIALITY: You and/or your care partner have signed paperwork which will be entered into your electronic medical record.  These signatures attest to the fact that that the information above on your After Visit Summary has been reviewed and is understood.  Full responsibility of the confidentiality of this discharge information lies with you and/or your care-partner.    Handouts were given to your care partner on diverticulosis  and hemorrhoids. You may resume your current medications today. Repeat colonoscopy in 10 years for screening purposes. Please call if any questions or concerns.

## 2016-10-07 NOTE — Progress Notes (Signed)
No problems noted in the recovery room. maw 

## 2016-10-07 NOTE — Op Note (Signed)
[facility] Patient Name: Kathryn Reed Procedure Date: 10/07/2016 2:19 PM MRN: [id number] Endoscopist: Napoleon Form , MD Age: 65 Referring MD:  Date of Birth: [birth year] Gender: Female Account #: [id number] Procedure:                Colonoscopy Indications:              Screening for colorectal malignant neoplasm, Last                            colonoscopy: 2007 Medicines:                Monitored Anesthesia Care Procedure:                Pre-Anesthesia Assessment:                           - Prior to the procedure, a History and Physical                            was performed, and patient medications and                            allergies were reviewed. The patient's tolerance of                            previous anesthesia was also reviewed. The risks                            and benefits of the procedure and the sedation                            options and risks were discussed with the patient.                            All questions were answered, and informed consent                            was obtained. Prior Anticoagulants: The patient has                            taken no previous anticoagulant or antiplatelet                            agents. ASA Grade Assessment: II - A patient with                            mild systemic disease. After reviewing the risks                            and benefits, the patient was deemed in                            satisfactory condition to undergo the procedure.  After obtaining informed consent, the colonoscope                            was passed under direct vision. Throughout the                            procedure, the patient's blood pressure, pulse, and                            oxygen saturations were monitored continuously. The                            Model CF-HQ190L [id number](SN#[id number]) scope was introduced                            through the anus and advanced to the  the cecum,                            identified by appendiceal orifice and ileocecal                            valve. The colonoscopy was performed without                            difficulty. The patient tolerated the procedure                            well. The quality of the bowel preparation was                            excellent. The ileocecal valve, appendiceal                            orifice, and rectum were photographed. Scope In: 2:37:50 PM Scope Out: 2:57:41 PM Scope Withdrawal Time: 0 hours 7 minutes 48 seconds  Total Procedure Duration: 0 hours 19 minutes 51 seconds  Findings:                 The perianal and digital rectal examinations were                            normal.                           A few small-mouthed diverticula were found in the                            sigmoid colon and descending colon.                           Non-bleeding internal hemorrhoids were found during                            retroflexion. The hemorrhoids were small.  The exam was otherwise without abnormality. Complications:            No immediate complications. Estimated Blood Loss:     Estimated blood loss: none. Impression:               - Diverticulosis in the sigmoid colon and in the                            descending colon.                           - Non-bleeding internal hemorrhoids.                           - The examination was otherwise normal.                           - No specimens collected. Recommendation:           - Patient has a contact number available for                            emergencies. The signs and symptoms of potential                            delayed complications were discussed with the                            patient. Return to normal activities tomorrow.                            Written discharge instructions were provided to the                            patient.                           - Resume  previous diet.                           - Continue present medications.                           - Repeat colonoscopy in 10 years for screening                            purposes.                           - Return to GI clinic PRN. Napoleon FormKavitha V. Nandigam, MD 10/07/2016 3:04:54 PM This report has been signed electronically.

## 2016-10-07 NOTE — Progress Notes (Signed)
To PACU  Awake and alert, Report to RN 

## 2016-10-08 ENCOUNTER — Telehealth: Payer: Self-pay | Admitting: *Deleted

## 2016-10-08 NOTE — Telephone Encounter (Signed)
  Follow up Call-  Call back number 10/07/2016  Post procedure Call Back phone  # [phone]  Permission to leave phone message Yes  Some recent data might be hidden     Patient questions:  Do you have a fever, pain , or abdominal swelling? No. Pain Score  0 *  Have you tolerated food without any problems? Yes.    Have you been able to return to your normal activities? Yes.    Do you have any questions about your discharge instructions: Diet   No. Medications  No. Follow up visit  No.  Do you have questions or concerns about your Care? No.  Actions: * If pain score is 4 or above: No action needed, pain <4.

## 2016-11-27 DIAGNOSIS — H2513 Age-related nuclear cataract, bilateral: Secondary | ICD-10-CM | POA: Insufficient documentation

## 2017-05-28 DIAGNOSIS — H25013 Cortical age-related cataract, bilateral: Secondary | ICD-10-CM | POA: Insufficient documentation

## 2017-05-28 DIAGNOSIS — H43813 Vitreous degeneration, bilateral: Secondary | ICD-10-CM | POA: Insufficient documentation

## 2017-05-28 DIAGNOSIS — H5203 Hypermetropia, bilateral: Secondary | ICD-10-CM | POA: Insufficient documentation

## 2017-05-28 DIAGNOSIS — E119 Type 2 diabetes mellitus without complications: Secondary | ICD-10-CM | POA: Insufficient documentation

## 2017-06-25 DIAGNOSIS — Z1231 Encounter for screening mammogram for malignant neoplasm of breast: Secondary | ICD-10-CM | POA: Diagnosis not present

## 2017-08-06 DIAGNOSIS — Z87891 Personal history of nicotine dependence: Secondary | ICD-10-CM | POA: Diagnosis not present

## 2017-08-06 DIAGNOSIS — Z1272 Encounter for screening for malignant neoplasm of vagina: Secondary | ICD-10-CM | POA: Diagnosis not present

## 2017-08-06 DIAGNOSIS — Z90710 Acquired absence of both cervix and uterus: Secondary | ICD-10-CM | POA: Diagnosis not present

## 2017-08-06 DIAGNOSIS — Z01419 Encounter for gynecological examination (general) (routine) without abnormal findings: Secondary | ICD-10-CM | POA: Diagnosis not present

## 2017-08-23 ENCOUNTER — Encounter: Payer: Self-pay | Admitting: Family Medicine

## 2017-08-23 ENCOUNTER — Ambulatory Visit (INDEPENDENT_AMBULATORY_CARE_PROVIDER_SITE_OTHER): Payer: Medicare Other | Admitting: Family Medicine

## 2017-08-23 DIAGNOSIS — M25561 Pain in right knee: Secondary | ICD-10-CM

## 2017-08-23 NOTE — Progress Notes (Signed)
PCP: Laurena Slimmerlark, Preston S, MD  Subjective:   HPI: Patient is a 66 y.o. female here for right knee pain.  Patient denies known injury or trauma. She states pain comes and goes in right knee anteriorly past 2 1/2 weeks. She recalls getting something out of a lazy susan but also did not have pain with this. Has tried ibuprofen, icing. Had pain posteriorly last week. Pain level now 0/10, has been dull and deep. No skin changes, numbness.  Past Medical History:  Diagnosis Date  . Allergy   . Hypertension   . Pre-diabetes    diet controlled  . Thyroid disease     Current Outpatient Medications on File Prior to Visit  Medication Sig Dispense Refill  . conjugated estrogens (PREMARIN) vaginal cream Insert 1/4 applicator vaginally q HS 2wk prior to appt.  Stop using it 2d prior to appt.    Marland Kitchen. acetaminophen (TYLENOL) 325 MG tablet Take 650 mg by mouth every 6 (six) hours as needed.    Marland Kitchen. amLODipine (NORVASC) 5 MG tablet Take 5 mg by mouth daily.      . cholecalciferol (VITAMIN D) 1000 units tablet Take 3,000 Units by mouth daily.    . fluticasone (FLONASE) 50 MCG/ACT nasal spray 2 sprays. PRN    . levothyroxine (SYNTHROID) 125 MCG tablet Take 125 mcg by mouth daily before breakfast.    . losartan-hydrochlorothiazide (HYZAAR) 100-25 MG per tablet Take 1 tablet by mouth daily.       Current Facility-Administered Medications on File Prior to Visit  Medication Dose Route Frequency Provider Last Rate Last Dose  . 0.9 %  sodium chloride infusion  500 mL Intravenous Continuous Nandigam, Eleonore ChiquitoKavitha V, MD        Past Surgical History:  Procedure Laterality Date  . ABDOMINAL HYSTERECTOMY  1987   1 OVARY REMOVED  . COLONOSCOPY  07/20/2006   DB  . MYOMECTOMY     30+ yrs ago   . THYROID SURGERY  15 YRS AGO   LEFT SIDE    Allergies  Allergen Reactions  . Sulfa Antibiotics     RASH  . Metformin Other (See Comments)    Social History   Socioeconomic History  . Marital status: Married     Spouse name: Not on file  . Number of children: Not on file  . Years of education: Not on file  . Highest education level: Not on file  Social Needs  . Financial resource strain: Not on file  . Food insecurity - worry: Not on file  . Food insecurity - inability: Not on file  . Transportation needs - medical: Not on file  . Transportation needs - non-medical: Not on file  Occupational History  . Not on file  Tobacco Use  . Smoking status: Former Smoker    Packs/day: 0.50    Years: 10.00    Pack years: 5.00    Types: Cigarettes    Last attempt to quit: 10/13/1995    Years since quitting: 21.8  . Smokeless tobacco: Never Used  Substance and Sexual Activity  . Alcohol use: No  . Drug use: No  . Sexual activity: Not on file  Other Topics Concern  . Not on file  Social History Narrative  . Not on file    Family History  Problem Relation Age of Onset  . Heart attack Mother   . Hypertension Mother   . Heart attack Father   . Hypertension Father   . Heart attack Sister   .  Diabetes Sister   . Hypertension Sister   . Hyperlipidemia Brother   . Sudden death Neg Hx   . Colon cancer Neg Hx   . Colon polyps Neg Hx   . Esophageal cancer Neg Hx   . Rectal cancer Neg Hx   . Stomach cancer Neg Hx   . Breast cancer Neg Hx     BP 121/73   Pulse 77   Ht 5\' 7"  (1.702 m)   Wt 265 lb (120.2 kg)   BMI 41.50 kg/m   Review of Systems: See HPI above.     Objective:  Physical Exam:  Gen: NAD, comfortable in exam room  Right knee: Mild-mod effusion.  No other gross deformity, ecchymoses. TTP medial and lateral joint lines.  No other tenderness. FROM. Negative ant/post drawers. Negative valgus/varus testing. Negative lachmanns. Negative mcmurrays, apleys, patellar apprehension. NV intact distally.  Left knee: No gross deformity, ecchymoses, swelling. No TTP. FROM with full strength. Negative ant/post drawers. Negative valgus/varus testing. NV intact distally.    Assessment & Plan:  1. Right knee pain - consistent with arthritis.  Discussed tylenol, topical medications, supplements, aleve/ibuprofen.  Consider injection.  Shown home exercises to do daily.  Consider physical therapy.  Icing, elevation, compression.  F/u in 6 weeks.

## 2017-08-23 NOTE — Patient Instructions (Signed)
Your pain and knee swelling are due to arthritis. These are the different medications you can take for this: Tylenol 500mg  1-2 tabs three times a day for pain. Capsaicin, aspercreme, or biofreeze topically up to four times a day may also help with pain. Some supplements that may help for arthritis: Boswellia extract, curcumin, pycnogenol Aleve 1-2 tabs twice a day with food OR ibuprofen 600mg  three times a day with food for pain and inflammation - typically take for 7-10 days then as needed. Cortisone injections are an option. If cortisone injections do not help, there are different types of shots that may help but they take longer to take effect. It's important that you continue to stay active. Straight leg raises, knee extensions 3 sets of 10 once a day (add ankle weight if these become too easy). Consider physical therapy to strengthen muscles around the joint that hurts to take pressure off of the joint itself. Shoe inserts with good arch support may be helpful. Ice 15 minutes at a time 3-4 times a day as needed to help with pain and swelling. Compression sleeve during the day to keep swelling down. Elevation as much as possible also. Water aerobics and cycling with low resistance are the best two types of exercise for arthritis though any exercise is ok as long as it doesn't worsen the pain. Follow up with me in 6 weeks (sooner if you're not doing well and/or want to try the injection).

## 2017-08-23 NOTE — Assessment & Plan Note (Signed)
consistent with arthritis.  Discussed tylenol, topical medications, supplements, aleve/ibuprofen.  Consider injection.  Shown home exercises to do daily.  Consider physical therapy.  Icing, elevation, compression.  F/u in 6 weeks.

## 2017-09-15 DIAGNOSIS — I10 Essential (primary) hypertension: Secondary | ICD-10-CM | POA: Diagnosis not present

## 2017-09-15 DIAGNOSIS — E119 Type 2 diabetes mellitus without complications: Secondary | ICD-10-CM | POA: Diagnosis not present

## 2017-09-15 DIAGNOSIS — M150 Primary generalized (osteo)arthritis: Secondary | ICD-10-CM | POA: Diagnosis not present

## 2017-09-15 DIAGNOSIS — E039 Hypothyroidism, unspecified: Secondary | ICD-10-CM | POA: Diagnosis not present

## 2017-09-15 DIAGNOSIS — E7800 Pure hypercholesterolemia, unspecified: Secondary | ICD-10-CM | POA: Diagnosis not present

## 2017-09-15 DIAGNOSIS — R7302 Impaired glucose tolerance (oral): Secondary | ICD-10-CM | POA: Diagnosis not present

## 2017-09-15 DIAGNOSIS — Z13220 Encounter for screening for lipoid disorders: Secondary | ICD-10-CM | POA: Diagnosis not present

## 2017-09-27 ENCOUNTER — Encounter: Payer: Self-pay | Admitting: Family Medicine

## 2017-09-27 ENCOUNTER — Ambulatory Visit (INDEPENDENT_AMBULATORY_CARE_PROVIDER_SITE_OTHER): Payer: Medicare Other | Admitting: Family Medicine

## 2017-09-27 DIAGNOSIS — M25561 Pain in right knee: Secondary | ICD-10-CM

## 2017-09-27 NOTE — Assessment & Plan Note (Signed)
2/2 arthritis.  Clinically improving.  Icing, tylenol or ibuprofen only if needed.  Continue home exercises for another 6 weeks.  F/u prn.

## 2017-09-27 NOTE — Progress Notes (Signed)
PCP: Laurena Slimmerlark, Preston S, MD  Subjective:   HPI: Patient is a 66 y.o. female here for right knee pain.  11/12: Patient denies known injury or trauma. She states pain comes and goes in right knee anteriorly past 2 1/2 weeks. She recalls getting something out of a lazy susan but also did not have pain with this. Has tried ibuprofen, icing. Had pain posteriorly last week. Pain level now 0/10, has been dull and deep. No skin changes, numbness.  12/17: Patient reports she's doing well. Pain level is 0/10 currently. Feels about 80% improved compared to last visit. Icing when needed. Doing some motion exercises, walking. Not requiring medication for pain. No skin changes.  Past Medical History:  Diagnosis Date  . Allergy   . Hypertension   . Pre-diabetes    diet controlled  . Thyroid disease     Current Outpatient Medications on File Prior to Visit  Medication Sig Dispense Refill  . acetaminophen (TYLENOL) 325 MG tablet Take 650 mg by mouth every 6 (six) hours as needed.    Marland Kitchen. amLODipine (NORVASC) 5 MG tablet Take 5 mg by mouth daily.      . cholecalciferol (VITAMIN D) 1000 units tablet Take 3,000 Units by mouth daily.    Marland Kitchen. conjugated estrogens (PREMARIN) vaginal cream Insert 1/4 applicator vaginally q HS 2wk prior to appt.  Stop using it 2d prior to appt.    . fluticasone (FLONASE) 50 MCG/ACT nasal spray 2 sprays. PRN    . levothyroxine (SYNTHROID) 125 MCG tablet Take 125 mcg by mouth daily before breakfast.    . losartan-hydrochlorothiazide (HYZAAR) 100-25 MG per tablet Take 1 tablet by mouth daily.       Current Facility-Administered Medications on File Prior to Visit  Medication Dose Route Frequency Provider Last Rate Last Dose  . 0.9 %  sodium chloride infusion  500 mL Intravenous Continuous Nandigam, Eleonore ChiquitoKavitha V, MD        Past Surgical History:  Procedure Laterality Date  . ABDOMINAL HYSTERECTOMY  1987   1 OVARY REMOVED  . COLONOSCOPY  07/20/2006   DB  . MYOMECTOMY      30+ yrs ago   . THYROID SURGERY  15 YRS AGO   LEFT SIDE  . THYROIDECTOMY Right 04/09/2016   Procedure: COMPLETION THYROIDECTOMY;  Surgeon: Avel Peaceodd Rosenbower, MD;  Location: [facility];  Service: General;  Laterality: Right;    Allergies  Allergen Reactions  . Sulfa Antibiotics     RASH  . Metformin Other (See Comments)    Social History   Socioeconomic History  . Marital status: Married    Spouse name: Not on file  . Number of children: Not on file  . Years of education: Not on file  . Highest education level: Not on file  Social Needs  . Financial resource strain: Not on file  . Food insecurity - worry: Not on file  . Food insecurity - inability: Not on file  . Transportation needs - medical: Not on file  . Transportation needs - non-medical: Not on file  Occupational History  . Not on file  Tobacco Use  . Smoking status: Former Smoker    Packs/day: 0.50    Years: 10.00    Pack years: 5.00    Types: Cigarettes    Last attempt to quit: 10/13/1995    Years since quitting: 21.9  . Smokeless tobacco: Never Used  Substance and Sexual Activity  . Alcohol use: No  . Drug use: No  .  Sexual activity: Not on file  Other Topics Concern  . Not on file  Social History Narrative  . Not on file    Family History  Problem Relation Age of Onset  . Heart attack Mother   . Hypertension Mother   . Heart attack Father   . Hypertension Father   . Heart attack Sister   . Diabetes Sister   . Hypertension Sister   . Hyperlipidemia Brother   . Sudden death Neg Hx   . Colon cancer Neg Hx   . Colon polyps Neg Hx   . Esophageal cancer Neg Hx   . Rectal cancer Neg Hx   . Stomach cancer Neg Hx   . Breast cancer Neg Hx     BP 118/75   Pulse 76   Ht 5\' 7"  (1.702 m)   Wt 267 lb (121.1 kg)   BMI 41.82 kg/m   Review of Systems: See HPI above.     Objective:  Physical Exam:  Gen: NAD, comfortable in exam room.  Right knee: Mild effusion.  No other gross deformity,  ecchymoses. No TTP. FROM with 5/5 strength. Negative ant/post drawers. Negative valgus/varus testing. Negative lachmanns. Negative mcmurrays, apleys, patellar apprehension. NV intact distally.   Assessment & Plan:  1. Right knee pain - 2/2 arthritis.  Clinically improving.  Icing, tylenol or ibuprofen only if needed.  Continue home exercises for another 6 weeks.  F/u prn.

## 2017-09-27 NOTE — Patient Instructions (Signed)
You're doing great! Ice the knee if needed as you have been. Do home exercises for another 6 weeks then you should be able to stop these. Follow up with me as needed.

## 2017-12-29 DIAGNOSIS — E039 Hypothyroidism, unspecified: Secondary | ICD-10-CM | POA: Diagnosis not present

## 2017-12-29 DIAGNOSIS — I10 Essential (primary) hypertension: Secondary | ICD-10-CM | POA: Diagnosis not present

## 2017-12-29 DIAGNOSIS — E119 Type 2 diabetes mellitus without complications: Secondary | ICD-10-CM | POA: Diagnosis not present

## 2017-12-29 DIAGNOSIS — Z23 Encounter for immunization: Secondary | ICD-10-CM | POA: Diagnosis not present

## 2017-12-29 DIAGNOSIS — E663 Overweight: Secondary | ICD-10-CM | POA: Diagnosis not present

## 2017-12-29 DIAGNOSIS — E7800 Pure hypercholesterolemia, unspecified: Secondary | ICD-10-CM | POA: Diagnosis not present

## 2017-12-29 DIAGNOSIS — Z6841 Body Mass Index (BMI) 40.0 and over, adult: Secondary | ICD-10-CM | POA: Diagnosis not present

## 2018-02-13 IMAGING — US US THYROID BIOPSY
1 series · 12 of 12 positions shown · non-contrast
Comparison: 09/11/2015

INDICATION: 64-year-old female with a history of prior left-sided thyroidectomy.

[Series 1: us thyroid biopsy · 0.07mm/px · 12 acquisitions, 12 frames shown]
[im 1/12]
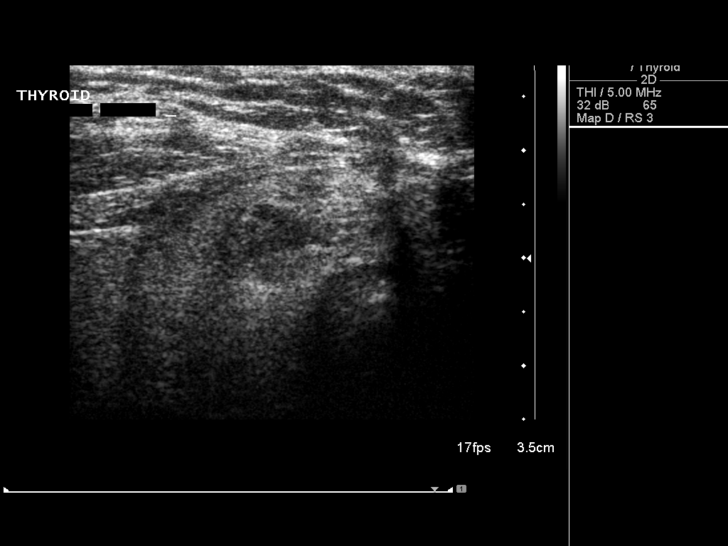
[im 2/12]
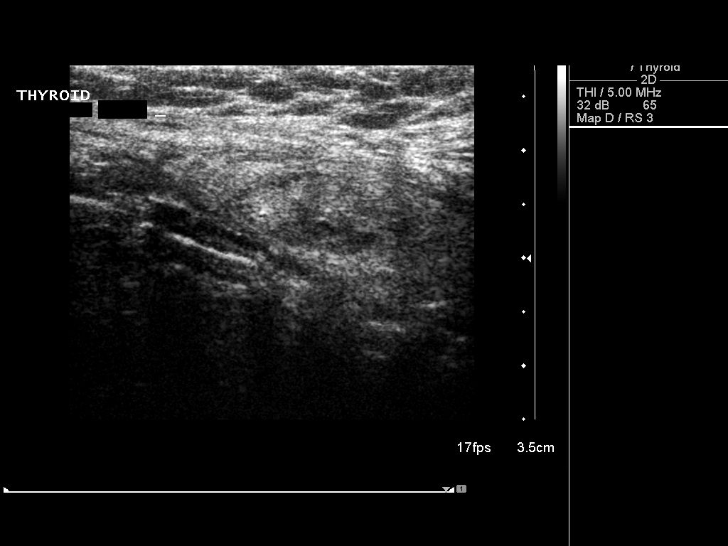
[im 3/12]
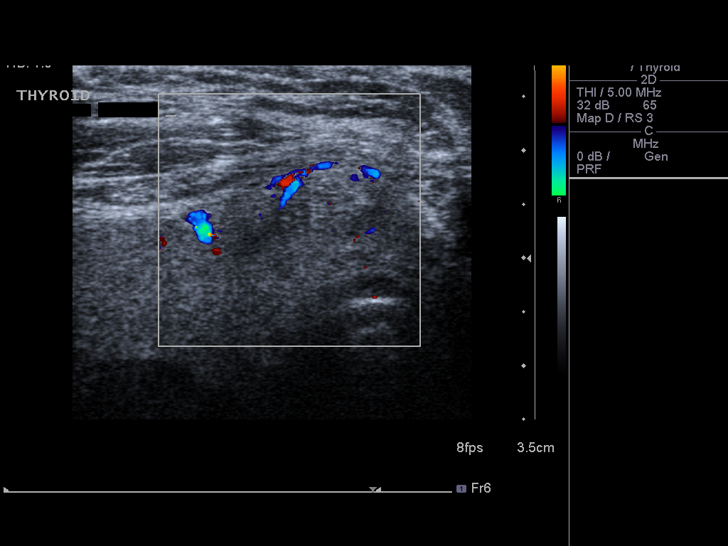
[im 4/12]
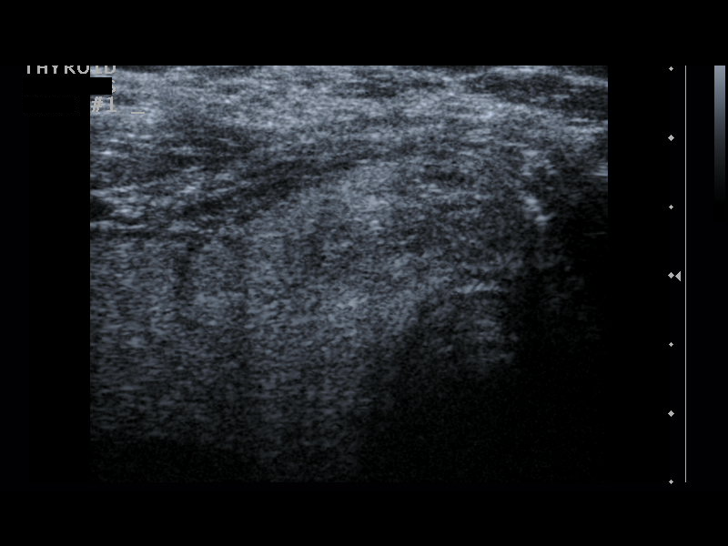
[im 5/12]
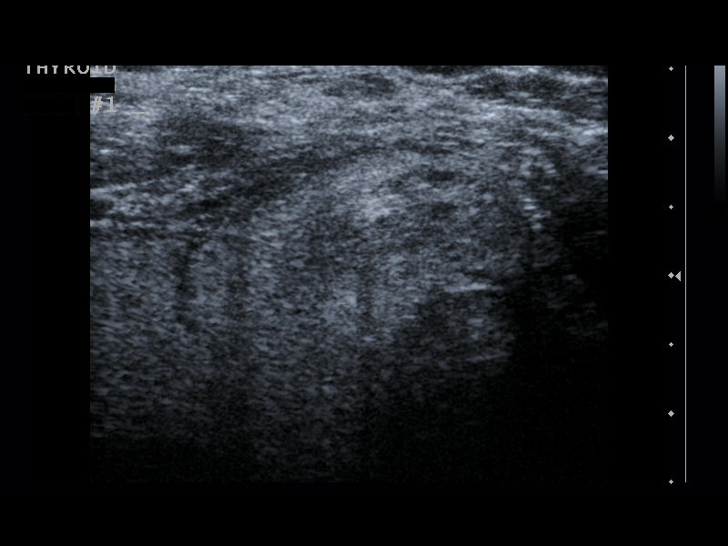
[im 6/12]
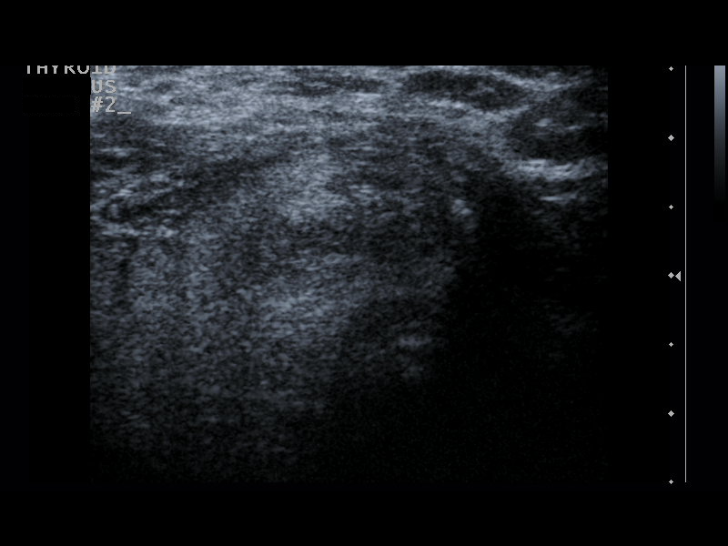
[im 7/12]
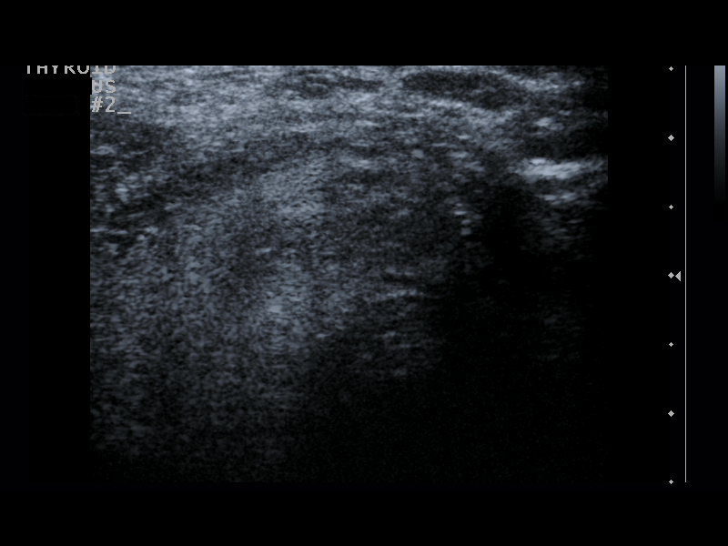
[im 8/12]
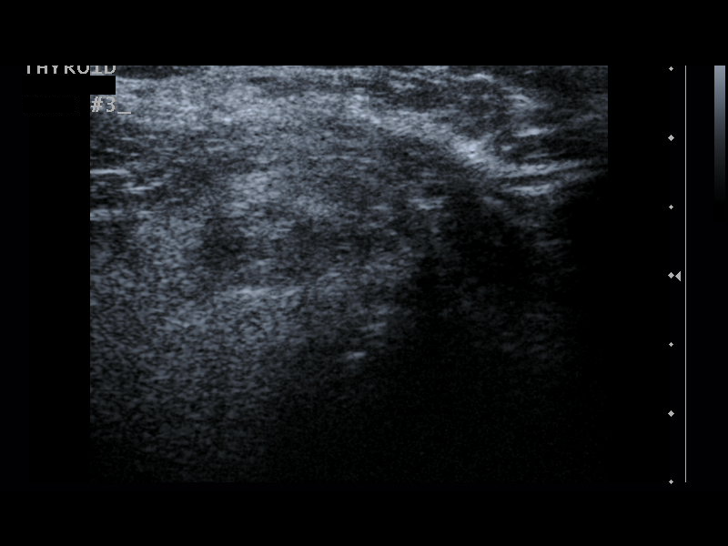
[im 9/12]
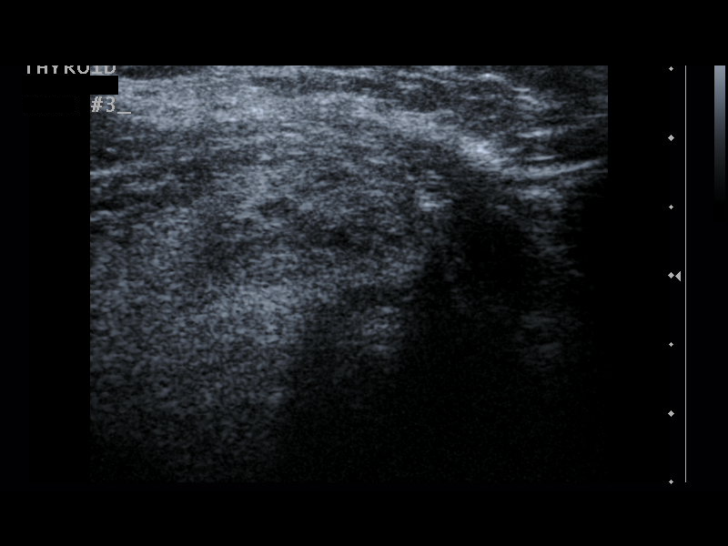
[im 10/12]
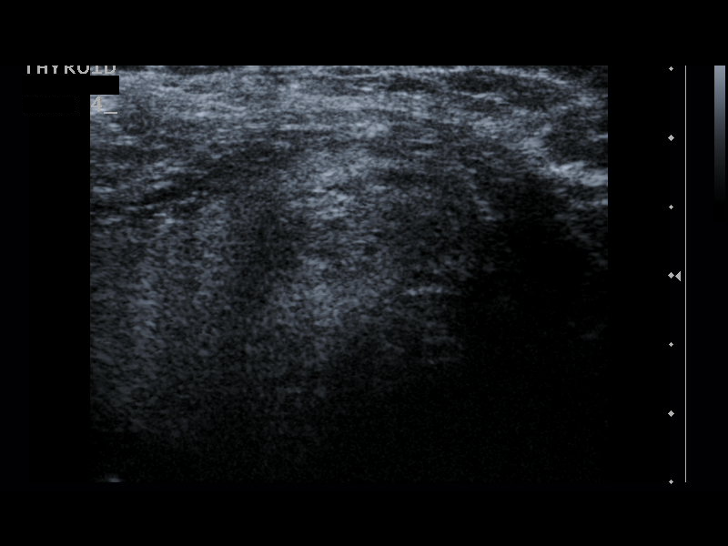
[im 11/12]
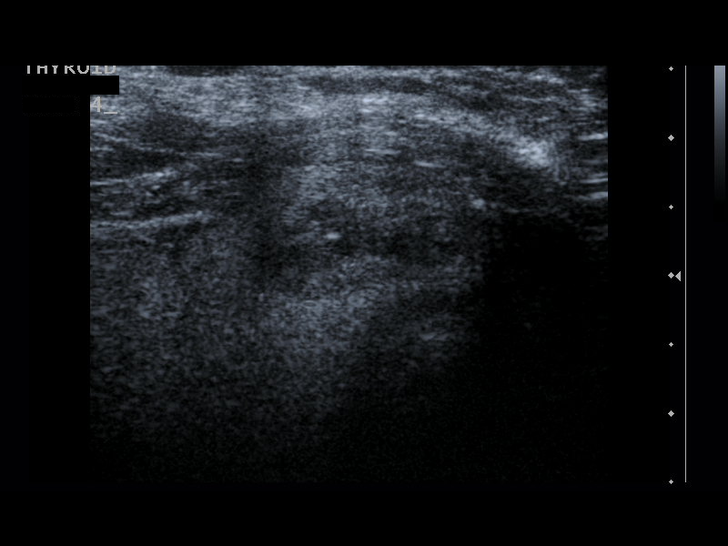
[im 12/12]
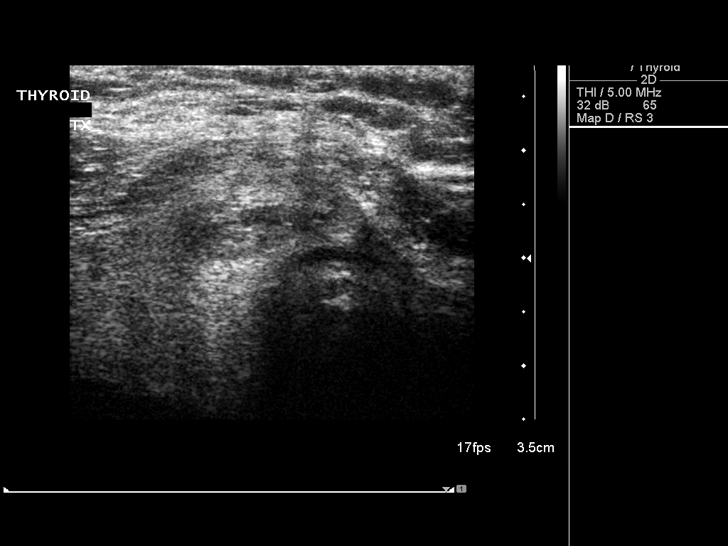

[12 of 12 positions shown; findings below may reference images not displayed]

She now has a significantly enlarging isthmic nodule, and has been
referred for biopsy.

Of note, she also has a relatively stable right thyroid nodule. Case
was discussed personally with Dr. Jerika to confirm isthmic
nodule is the target.

EXAM:
ULTRASOUND GUIDED NEEDLE ASPIRATE BIOPSY OF THE THYROID GLAND
MEDICATIONS:
1% lidocaine

COMPLICATIONS:
None

PROCEDURE:
Informed written consent was obtained from the patient after a
thorough discussion of the procedural risks, benefits and
alternatives. All questions were addressed. Maximal Sterile Barrier
Technique was utilized including caps, mask, sterile gowns, sterile
gloves, sterile drape, hand hygiene and skin antiseptic. A timeout
was performed prior to the initiation of the procedure.

The procedure, risks, benefits, and alternatives were explained to
the patient. Questions regarding the procedure were encouraged and
answered. The patient understands and consents to the procedure.

Ultrasound survey was performed with images stored and sent to PACs.

The midline neck was prepped with Betadine in a sterile fashion, and
a sterile drape was applied covering the operative field. A sterile
gown and sterile gloves were used for the procedure. Local
anesthesia was provided with 1% Lidocaine.

Ultrasound guidance was used to infiltrate the region with 1%
lidocaine for local anesthesia. Four separate 25 gauge fine needle
biopsy were then acquired of the isthmic nodule using ultrasound
guidance. Images were stored.

Slide preparation was performed.

Final image was stored after biopsy.

Patient tolerated the procedure well and remained hemodynamically
stable throughout.

No complications were encountered and no significant blood loss was
encounter
IMPRESSION: Status post ultrasound-guided biopsy of the isthmic nodule, with
tissue specimen sent to pathology for complete histopathologic
analysis.

## 2018-03-03 ENCOUNTER — Ambulatory Visit (INDEPENDENT_AMBULATORY_CARE_PROVIDER_SITE_OTHER): Payer: Medicare Other | Admitting: Family Medicine

## 2018-03-03 ENCOUNTER — Encounter: Payer: Self-pay | Admitting: Family Medicine

## 2018-03-03 DIAGNOSIS — M25561 Pain in right knee: Secondary | ICD-10-CM

## 2018-03-03 NOTE — Patient Instructions (Signed)
Your pain and knee swelling are due to arthritis. These are the different medications you can take for this: Tylenol  1-2 tabs three times a day for pain. Capsaicin, aspercreme, or biofreeze topically up to four times a day may also help with pain. Some supplements that may help for arthritis: Boswellia extract, curcumin, pycnogenol Aleve 1-2 tabs twice a day with food OR ibuprofen  three times a day with food for pain and inflammation as needed. We can drain and inject the knee if this becomes very painful but I wouldn't recommend this otherwise. If cortisone injections do not help, there are different types of shots that may help but they take longer to take effect. It's important that you continue to stay active. Straight leg raises, knee extensions 3 sets of 10 once a day (add ankle weight if these become too easy). Consider physical therapy to strengthen muscles around the joint that hurts to take pressure off of the joint itself. Shoe inserts with good arch support may be helpful. Ice 15 minutes at a time 3-4 times a day as needed to help with pain and swelling. Compression sleeve during the day to keep swelling down. Elevation as much as possible also. Water aerobics and cycling with low resistance are the best two types of exercise for arthritis though any exercise is ok as long as it doesn't worsen the pain. Follow up with me as needed otherwise - no restrictions on your activities.

## 2018-03-05 ENCOUNTER — Encounter: Payer: Self-pay | Admitting: Family Medicine

## 2018-03-05 NOTE — Progress Notes (Signed)
PCP: Laurena Slimmer, MD  Subjective:   HPI: Patient is a 67 y.o. female here for right knee pain.  11/12: Patient denies known injury or trauma. She states pain comes and goes in right knee anteriorly past 2 1/2 weeks. She recalls getting something out of a lazy susan but also did not have pain with this. Has tried ibuprofen, icing. Had pain posteriorly last week. Pain level now 0/10, has been dull and deep. No skin changes, numbness.  09/27/17: Patient reports she's doing well. Pain level is 0/10 currently. Feels about 80% improved compared to last visit. Icing when needed. Doing some motion exercises, walking. Not requiring medication for pain. No skin changes.  03/03/18: Patient reports she's developed swelling in her right knee that is worse than previously. Pain level 1/10 anteriorly, feels full and heavy. Radiates posteriorly. Using sleeve, icing, taking ibuprofen occasionally which help. No other skin changes. No numbness.  Past Medical History:  Diagnosis Date  . Allergy   . Hypertension   . Pre-diabetes    diet controlled  . Thyroid disease     Current Outpatient Medications on File Prior to Visit  Medication Sig Dispense Refill  . acetaminophen (TYLENOL) 325 MG tablet Take 650 mg by mouth every 6 (six) hours as needed.    Marland Kitchen amLODipine (NORVASC) 5 MG tablet Take 5 mg by mouth daily.      . cholecalciferol (VITAMIN D) 1000 units tablet Take 3,000 Units by mouth daily.    Marland Kitchen conjugated estrogens (PREMARIN) vaginal cream Insert 1/4 applicator vaginally q HS 2wk prior to appt.  Stop using it 2d prior to appt.    . fluticasone (FLONASE) 50 MCG/ACT nasal spray 2 sprays. PRN    . glimepiride (AMARYL) 2 MG tablet     . losartan-hydrochlorothiazide (HYZAAR) 100-25 MG per tablet Take 1 tablet by mouth daily.      . rosuvastatin (CRESTOR) 5 MG tablet     . SYNTHROID 137 MCG tablet      Current Facility-Administered Medications on File Prior to Visit  Medication  Dose Route Frequency Provider Last Rate Last Dose  . 0.9 %  sodium chloride infusion  500 mL Intravenous Continuous Nandigam, Eleonore Chiquito, MD        Past Surgical History:  Procedure Laterality Date  . ABDOMINAL HYSTERECTOMY  1987   1 OVARY REMOVED  . COLONOSCOPY  07/20/2006   DB  . MYOMECTOMY     30+ yrs ago   . THYROID SURGERY  15 YRS AGO   LEFT SIDE  . THYROIDECTOMY Right 04/09/2016   Procedure: COMPLETION THYROIDECTOMY;  Surgeon: Avel Peace, MD;  Location: [facility];  Service: General;  Laterality: Right;    Allergies  Allergen Reactions  . Sulfa Antibiotics     RASH  . Metformin Other (See Comments)    Social History   Socioeconomic History  . Marital status: Married    Spouse name: Not on file  . Number of children: Not on file  . Years of education: Not on file  . Highest education level: Not on file  Occupational History  . Not on file  Social Needs  . Financial resource strain: Not on file  . Food insecurity:    Worry: Not on file    Inability: Not on file  . Transportation needs:    Medical: Not on file    Non-medical: Not on file  Tobacco Use  . Smoking status: Former Smoker    Packs/day: 0.50  Years: 10.00    Pack years: 5.00    Types: Cigarettes    Last attempt to quit: 10/13/1995    Years since quitting: 22.4  . Smokeless tobacco: Never Used  Substance and Sexual Activity  . Alcohol use: No  . Drug use: No  . Sexual activity: Not on file  Lifestyle  . Physical activity:    Days per week: Not on file    Minutes per session: Not on file  . Stress: Not on file  Relationships  . Social connections:    Talks on phone: Not on file    Gets together: Not on file    Attends religious service: Not on file    Active member of club or organization: Not on file    Attends meetings of clubs or organizations: Not on file    Relationship status: Not on file  . Intimate partner violence:    Fear of current or ex partner: Not on file    Emotionally  abused: Not on file    Physically abused: Not on file    Forced sexual activity: Not on file  Other Topics Concern  . Not on file  Social History Narrative  . Not on file    Family History  Problem Relation Age of Onset  . Heart attack Mother   . Hypertension Mother   . Heart attack Father   . Hypertension Father   . Heart attack Sister   . Diabetes Sister   . Hypertension Sister   . Hyperlipidemia Brother   . Sudden death Neg Hx   . Colon cancer Neg Hx   . Colon polyps Neg Hx   . Esophageal cancer Neg Hx   . Rectal cancer Neg Hx   . Stomach cancer Neg Hx   . Breast cancer Neg Hx     BP 131/78   Pulse 81   Ht  (1.702 m)   Wt 253 lb (114.8 kg)   BMI 39.63 kg/m   Review of Systems: See HPI above.     Objective:  Physical Exam:  Gen: NAD, comfortable in exam room  Right knee: Mod effusion.  No other gross deformity, ecchymoses. Mild TTP medial joint line.  No other tenderness. FROM with 5/5 strength. Negative ant/post drawers. Negative valgus/varus testing. Negative lachmanns. Negative mcmurrays, apleys, patellar apprehension. NV intact distally.  Left knee: No deformity. FROM with 5/5 strength. No tenderness to palpation. NVI distally.  Assessment & Plan:  1. Right knee pain - 2/2 arthritis.  Discussed tylenol, topical medications, aleve or ibuprofen, supplements that may help.  Consider aspiration/injection.  Shown home exercises to do daily.  Icing, compression, elevation.  F/u prn.

## 2018-03-05 NOTE — Assessment & Plan Note (Signed)
2/2 arthritis.  Discussed tylenol, topical medications, aleve or ibuprofen, supplements that may help.  Consider aspiration/injection.  Shown home exercises to do daily.  Icing, compression, elevation.  F/u prn.

## 2018-05-25 DIAGNOSIS — E039 Hypothyroidism, unspecified: Secondary | ICD-10-CM | POA: Diagnosis not present

## 2018-05-25 DIAGNOSIS — Z6841 Body Mass Index (BMI) 40.0 and over, adult: Secondary | ICD-10-CM | POA: Diagnosis not present

## 2018-05-25 DIAGNOSIS — E7800 Pure hypercholesterolemia, unspecified: Secondary | ICD-10-CM | POA: Diagnosis not present

## 2018-05-25 DIAGNOSIS — E1169 Type 2 diabetes mellitus with other specified complication: Secondary | ICD-10-CM | POA: Diagnosis not present

## 2018-05-25 DIAGNOSIS — I10 Essential (primary) hypertension: Secondary | ICD-10-CM | POA: Diagnosis not present

## 2018-05-25 DIAGNOSIS — E559 Vitamin D deficiency, unspecified: Secondary | ICD-10-CM | POA: Diagnosis not present

## 2018-05-31 DIAGNOSIS — E119 Type 2 diabetes mellitus without complications: Secondary | ICD-10-CM | POA: Diagnosis not present

## 2018-05-31 DIAGNOSIS — H16223 Keratoconjunctivitis sicca, not specified as Sjogren's, bilateral: Secondary | ICD-10-CM | POA: Diagnosis not present

## 2018-05-31 DIAGNOSIS — H04123 Dry eye syndrome of bilateral lacrimal glands: Secondary | ICD-10-CM | POA: Diagnosis not present

## 2018-05-31 DIAGNOSIS — H25013 Cortical age-related cataract, bilateral: Secondary | ICD-10-CM | POA: Diagnosis not present

## 2018-05-31 DIAGNOSIS — Z7984 Long term (current) use of oral hypoglycemic drugs: Secondary | ICD-10-CM | POA: Diagnosis not present

## 2018-05-31 DIAGNOSIS — H43813 Vitreous degeneration, bilateral: Secondary | ICD-10-CM | POA: Diagnosis not present

## 2018-05-31 DIAGNOSIS — H2513 Age-related nuclear cataract, bilateral: Secondary | ICD-10-CM | POA: Diagnosis not present

## 2018-05-31 DIAGNOSIS — H5203 Hypermetropia, bilateral: Secondary | ICD-10-CM | POA: Diagnosis not present

## 2018-06-27 DIAGNOSIS — Z1231 Encounter for screening mammogram for malignant neoplasm of breast: Secondary | ICD-10-CM | POA: Diagnosis not present

## 2018-08-08 DIAGNOSIS — N952 Postmenopausal atrophic vaginitis: Secondary | ICD-10-CM | POA: Diagnosis not present

## 2018-08-08 DIAGNOSIS — Z01419 Encounter for gynecological examination (general) (routine) without abnormal findings: Secondary | ICD-10-CM | POA: Diagnosis not present

## 2018-08-08 DIAGNOSIS — Z124 Encounter for screening for malignant neoplasm of cervix: Secondary | ICD-10-CM | POA: Diagnosis not present

## 2018-08-30 DIAGNOSIS — Z23 Encounter for immunization: Secondary | ICD-10-CM | POA: Diagnosis not present

## 2018-12-29 DIAGNOSIS — Z1389 Encounter for screening for other disorder: Secondary | ICD-10-CM | POA: Diagnosis not present

## 2018-12-29 DIAGNOSIS — E7800 Pure hypercholesterolemia, unspecified: Secondary | ICD-10-CM | POA: Diagnosis not present

## 2018-12-29 DIAGNOSIS — I10 Essential (primary) hypertension: Secondary | ICD-10-CM | POA: Diagnosis not present

## 2018-12-29 DIAGNOSIS — E2839 Other primary ovarian failure: Secondary | ICD-10-CM | POA: Diagnosis not present

## 2018-12-29 DIAGNOSIS — Z0000 Encounter for general adult medical examination without abnormal findings: Secondary | ICD-10-CM | POA: Diagnosis not present

## 2018-12-29 DIAGNOSIS — E559 Vitamin D deficiency, unspecified: Secondary | ICD-10-CM | POA: Diagnosis not present

## 2018-12-29 DIAGNOSIS — E1169 Type 2 diabetes mellitus with other specified complication: Secondary | ICD-10-CM | POA: Diagnosis not present

## 2018-12-29 DIAGNOSIS — Z79899 Other long term (current) drug therapy: Secondary | ICD-10-CM | POA: Diagnosis not present

## 2018-12-29 DIAGNOSIS — E039 Hypothyroidism, unspecified: Secondary | ICD-10-CM | POA: Diagnosis not present

## 2018-12-29 DIAGNOSIS — Z23 Encounter for immunization: Secondary | ICD-10-CM | POA: Diagnosis not present

## 2019-01-24 ENCOUNTER — Other Ambulatory Visit: Payer: Self-pay | Admitting: Internal Medicine

## 2019-01-24 DIAGNOSIS — R5381 Other malaise: Secondary | ICD-10-CM

## 2019-01-25 ENCOUNTER — Other Ambulatory Visit: Payer: Self-pay | Admitting: Internal Medicine

## 2019-01-25 DIAGNOSIS — Z13820 Encounter for screening for osteoporosis: Secondary | ICD-10-CM

## 2019-02-23 DIAGNOSIS — H25013 Cortical age-related cataract, bilateral: Secondary | ICD-10-CM | POA: Diagnosis not present

## 2019-02-23 DIAGNOSIS — H2513 Age-related nuclear cataract, bilateral: Secondary | ICD-10-CM | POA: Diagnosis not present

## 2019-02-23 DIAGNOSIS — E119 Type 2 diabetes mellitus without complications: Secondary | ICD-10-CM | POA: Diagnosis not present

## 2019-02-23 DIAGNOSIS — H43813 Vitreous degeneration, bilateral: Secondary | ICD-10-CM | POA: Diagnosis not present

## 2019-02-23 DIAGNOSIS — Z7984 Long term (current) use of oral hypoglycemic drugs: Secondary | ICD-10-CM | POA: Diagnosis not present

## 2019-02-23 DIAGNOSIS — H5203 Hypermetropia, bilateral: Secondary | ICD-10-CM | POA: Diagnosis not present

## 2019-04-26 DIAGNOSIS — N6459 Other signs and symptoms in breast: Secondary | ICD-10-CM | POA: Diagnosis not present

## 2019-05-05 ENCOUNTER — Other Ambulatory Visit: Payer: Medicare Other

## 2019-06-30 DIAGNOSIS — Z1231 Encounter for screening mammogram for malignant neoplasm of breast: Secondary | ICD-10-CM | POA: Diagnosis not present

## 2019-07-03 DIAGNOSIS — E039 Hypothyroidism, unspecified: Secondary | ICD-10-CM | POA: Diagnosis not present

## 2019-07-03 DIAGNOSIS — I10 Essential (primary) hypertension: Secondary | ICD-10-CM | POA: Diagnosis not present

## 2019-07-03 DIAGNOSIS — E7800 Pure hypercholesterolemia, unspecified: Secondary | ICD-10-CM | POA: Diagnosis not present

## 2019-07-03 DIAGNOSIS — Z23 Encounter for immunization: Secondary | ICD-10-CM | POA: Diagnosis not present

## 2019-07-03 DIAGNOSIS — E1169 Type 2 diabetes mellitus with other specified complication: Secondary | ICD-10-CM | POA: Diagnosis not present

## 2019-07-18 ENCOUNTER — Other Ambulatory Visit: Payer: Self-pay

## 2019-07-18 ENCOUNTER — Ambulatory Visit
Admission: RE | Admit: 2019-07-18 | Discharge: 2019-07-18 | Disposition: A | Discharge status: Home / Self Care | Payer: Medicare Other | Source: Ambulatory Visit | Attending: Internal Medicine | Admitting: Internal Medicine

## 2019-07-18 DIAGNOSIS — Z780 Asymptomatic menopausal state: Secondary | ICD-10-CM | POA: Diagnosis not present

## 2019-07-18 DIAGNOSIS — Z13820 Encounter for screening for osteoporosis: Secondary | ICD-10-CM | POA: Diagnosis not present

## 2019-08-14 DIAGNOSIS — N952 Postmenopausal atrophic vaginitis: Secondary | ICD-10-CM | POA: Diagnosis not present

## 2019-08-14 DIAGNOSIS — Z01419 Encounter for gynecological examination (general) (routine) without abnormal findings: Secondary | ICD-10-CM | POA: Diagnosis not present

## 2019-08-14 DIAGNOSIS — Z90710 Acquired absence of both cervix and uterus: Secondary | ICD-10-CM | POA: Diagnosis not present

## 2019-08-28 DIAGNOSIS — H43813 Vitreous degeneration, bilateral: Secondary | ICD-10-CM | POA: Diagnosis not present

## 2019-08-28 DIAGNOSIS — H524 Presbyopia: Secondary | ICD-10-CM | POA: Diagnosis not present

## 2019-08-28 DIAGNOSIS — Z7984 Long term (current) use of oral hypoglycemic drugs: Secondary | ICD-10-CM | POA: Diagnosis not present

## 2019-08-28 DIAGNOSIS — H2513 Age-related nuclear cataract, bilateral: Secondary | ICD-10-CM | POA: Diagnosis not present

## 2019-08-28 DIAGNOSIS — H5203 Hypermetropia, bilateral: Secondary | ICD-10-CM | POA: Diagnosis not present

## 2019-08-28 DIAGNOSIS — H25013 Cortical age-related cataract, bilateral: Secondary | ICD-10-CM | POA: Diagnosis not present

## 2019-08-28 DIAGNOSIS — E119 Type 2 diabetes mellitus without complications: Secondary | ICD-10-CM | POA: Diagnosis not present

## 2019-08-28 DIAGNOSIS — H52203 Unspecified astigmatism, bilateral: Secondary | ICD-10-CM | POA: Diagnosis not present

## 2019-08-28 DIAGNOSIS — H16223 Keratoconjunctivitis sicca, not specified as Sjogren's, bilateral: Secondary | ICD-10-CM | POA: Diagnosis not present

## 2019-11-04 ENCOUNTER — Emergency Department (HOSPITAL_BASED_OUTPATIENT_CLINIC_OR_DEPARTMENT_OTHER)
Admission: EM | Admit: 2019-11-04 | Discharge: 2019-11-04 | Disposition: A | Discharge status: Home / Self Care | Payer: Medicare Other | Attending: Emergency Medicine | Admitting: Emergency Medicine

## 2019-11-04 ENCOUNTER — Encounter (HOSPITAL_BASED_OUTPATIENT_CLINIC_OR_DEPARTMENT_OTHER): Payer: Self-pay

## 2019-11-04 ENCOUNTER — Other Ambulatory Visit: Payer: Self-pay

## 2019-11-04 ENCOUNTER — Emergency Department (HOSPITAL_BASED_OUTPATIENT_CLINIC_OR_DEPARTMENT_OTHER): Payer: Medicare Other

## 2019-11-04 DIAGNOSIS — E119 Type 2 diabetes mellitus without complications: Secondary | ICD-10-CM | POA: Diagnosis not present

## 2019-11-04 DIAGNOSIS — Y9389 Activity, other specified: Secondary | ICD-10-CM | POA: Insufficient documentation

## 2019-11-04 DIAGNOSIS — S76312A Strain of muscle, fascia and tendon of the posterior muscle group at thigh level, left thigh, initial encounter: Secondary | ICD-10-CM

## 2019-11-04 DIAGNOSIS — E039 Hypothyroidism, unspecified: Secondary | ICD-10-CM | POA: Insufficient documentation

## 2019-11-04 DIAGNOSIS — S76812A Strain of other specified muscles, fascia and tendons at thigh level, left thigh, initial encounter: Secondary | ICD-10-CM | POA: Diagnosis not present

## 2019-11-04 DIAGNOSIS — Z79899 Other long term (current) drug therapy: Secondary | ICD-10-CM | POA: Insufficient documentation

## 2019-11-04 DIAGNOSIS — Y929 Unspecified place or not applicable: Secondary | ICD-10-CM | POA: Diagnosis not present

## 2019-11-04 DIAGNOSIS — X500XXA Overexertion from strenuous movement or load, initial encounter: Secondary | ICD-10-CM | POA: Diagnosis not present

## 2019-11-04 DIAGNOSIS — M79605 Pain in left leg: Secondary | ICD-10-CM | POA: Insufficient documentation

## 2019-11-04 DIAGNOSIS — Y999 Unspecified external cause status: Secondary | ICD-10-CM | POA: Diagnosis not present

## 2019-11-04 DIAGNOSIS — Z87891 Personal history of nicotine dependence: Secondary | ICD-10-CM | POA: Diagnosis not present

## 2019-11-04 DIAGNOSIS — I10 Essential (primary) hypertension: Secondary | ICD-10-CM | POA: Diagnosis not present

## 2019-11-04 DIAGNOSIS — M25562 Pain in left knee: Secondary | ICD-10-CM | POA: Diagnosis not present

## 2019-11-04 DIAGNOSIS — Z7984 Long term (current) use of oral hypoglycemic drugs: Secondary | ICD-10-CM | POA: Diagnosis not present

## 2019-11-04 DIAGNOSIS — S8992XA Unspecified injury of left lower leg, initial encounter: Secondary | ICD-10-CM | POA: Diagnosis present

## 2019-11-04 NOTE — ED Notes (Signed)
ED Provider at bedside. 

## 2019-11-04 NOTE — ED Provider Notes (Signed)
[facility] Provider Note   CSN: [id number] Arrival date & time: 11/04/19  1046     History Chief Complaint  Patient presents with  . Knee Pain    Kathryn Reed is a 69 y.o. female.  She is complaining of pain behind her left knee for 3 days.  She said she was moving some plants around and was in an awkward position when she started noticing some aching pain behind her knee.  It seems to be worse with movement.  Improved with rest.  Not associate with any numbness or weakness.  The history is provided by the patient.  Knee Pain Location:  Knee Time since incident:  3 days Knee location:  L knee Pain details:    Quality:  Aching   Radiates to:  Does not radiate   Severity:  Moderate   Onset quality:  Gradual   Timing:  Intermittent   Progression:  Unchanged Chronicity:  New Dislocation: no   Relieved by:  Rest Worsened by:  Bearing weight and activity Ineffective treatments:  None tried Associated symptoms: no back pain, no decreased ROM, no fever, no muscle weakness, no numbness, no stiffness, no swelling and no tingling        Past Medical History:  Diagnosis Date  . Allergy   . Hypertension   . Pre-diabetes    diet controlled  . Thyroid disease     Patient Active Problem List   Diagnosis Date Noted  . Right knee pain 08/23/2017  . Posterior vitreous detachment of both eyes 05/28/2017  . Hypermetropia of both eyes 05/28/2017  . Diabetes mellitus without complication ([place]) 23/55/7322  . Cortical age-related cataract of both eyes 05/28/2017  . Nuclear sclerotic cataract of both eyes 11/27/2016  . Thyroid nodule 04/09/2016  . Presbyopia 07/19/2014  . Low grade squamous intraepithelial lesion (LGSIL) on cervical Pap smear 07/19/2014  . Keratoconjunctivitis sicca, not specified as Sjogren's 07/19/2014  . Dermatophytosis, nail 07/19/2014  . Pain in finger of right hand 08/17/2013  . Hypothyroid 01/26/2011  . Hypertension  01/26/2011    Past Surgical History:  Procedure Laterality Date  . ABDOMINAL HYSTERECTOMY  1987   1 OVARY REMOVED  . COLONOSCOPY  07/20/2006   DB  . MYOMECTOMY     30+ yrs ago   . THYROID SURGERY  15 YRS AGO   LEFT SIDE  . THYROIDECTOMY Right 04/09/2016   Procedure: COMPLETION THYROIDECTOMY;  Surgeon: Jackolyn Confer, MD;  Location: [facility];  Service: General;  Laterality: Right;     OB History   No obstetric history on file.     Family History  Problem Relation Age of Onset  . Heart attack Mother   . Hypertension Mother   . Heart attack Father   . Hypertension Father   . Heart attack Sister   . Diabetes Sister   . Hypertension Sister   . Hyperlipidemia Brother   . Sudden death Neg Hx   . Colon cancer Neg Hx   . Colon polyps Neg Hx   . Esophageal cancer Neg Hx   . Rectal cancer Neg Hx   . Stomach cancer Neg Hx   . Breast cancer Neg Hx     Social History   Tobacco Use  . Smoking status: Former Smoker    Packs/day: 0.50    Years: 10.00    Pack years: 5.00    Types: Cigarettes    Quit date: 10/13/1995    Years since quitting: 24.0  .  Smokeless tobacco: Never Used  Substance Use Topics  . Alcohol use: No  . Drug use: No    Home Medications Prior to Admission medications   Medication Sig Start Date End Date Taking? Authorizing Provider  acetaminophen (TYLENOL) 325 MG tablet Take 650 mg by mouth every 6 (six) hours as needed.    [provider]  amLODipine (NORVASC) 5 MG tablet Take 5 mg by mouth daily.   11/27/10   [provider]  cholecalciferol (VITAMIN D) 1000 units tablet Take 3,000 Units by mouth daily.    [provider]  conjugated estrogens (PREMARIN) vaginal cream Insert 1/4 applicator vaginally q HS 2wk prior to appt.  Stop using it 2d prior to appt. 07/05/17   [provider]  fluticasone (FLONASE) 50 MCG/ACT nasal spray 2 sprays. PRN 12/08/14   [provider]  glimepiride (AMARYL) 2 MG tablet  12/31/17    [provider]  losartan-hydrochlorothiazide (HYZAAR) 100-25 MG per tablet Take 1 tablet by mouth daily.   11/27/10   [provider]  rosuvastatin (CRESTOR) 5 MG tablet  12/31/17   [provider]  SYNTHROID 137 MCG tablet  01/27/18   [provider]    Allergies    Sulfa antibiotics and Metformin  Review of Systems   Review of Systems  Constitutional: Negative for fever.  Respiratory: Negative for shortness of breath.   Cardiovascular: Negative for chest pain.  Musculoskeletal: Negative for back pain and stiffness.  Skin: Negative for wound.  Neurological: Negative for weakness and numbness.    Physical Exam Updated Vital Signs BP (!) 159/89 (BP Location: Left Arm)   Pulse 81   Temp 97.9 F (36.6 C) (Oral)   Resp 18   Ht 5\' 7"  (1.702 m)   Wt 113.4 kg   SpO2 99%   BMI 39.16 kg/m   Physical Exam Constitutional:      Appearance: She is well-developed.  HENT:     Head: Normocephalic and atraumatic.  Eyes:     Conjunctiva/sclera: Conjunctivae normal.  Musculoskeletal:        General: Tenderness present. No deformity. Normal range of motion.     Cervical back: Neck supple.     Right lower leg: No edema.     Left lower leg: No edema.     Comments: Left hip and ankle nontender.  Left knee full range of motion.  She has some vague tenderness posterior knee.  Ligaments stable.  No effusion.  No overlying erythema or open wounds.  Distal neurovascular intact.  Skin:    General: Skin is warm and dry.     Capillary Refill: Capillary refill takes less than 2 seconds.  Neurological:     General: No focal deficit present.     Mental Status: She is alert.     GCS: GCS eye subscore is 4. GCS verbal subscore is 5. GCS motor subscore is 6.     Gait: Gait normal.     ED Results / Procedures / Treatments   Labs (all labs ordered are listed, but only abnormal results are displayed) Labs Reviewed - No data to display  EKG None  Radiology  Venous Img Lower  Left (DVT Study)  Result Date: 11/04/2019 CLINICAL DATA:  69 year old female with posterior knee pain EXAM: LEFT LOWER EXTREMITY VENOUS DOPPLER ULTRASOUND TECHNIQUE: Gray-scale sonography with graded compression, as well as color Doppler and duplex ultrasound were performed to evaluate the lower extremity deep venous systems from the level of  the common femoral vein and including the common femoral, femoral, profunda femoral, popliteal and calf veins including the posterior tibial, peroneal and gastrocnemius veins when visible. The superficial great saphenous vein was also interrogated. Spectral Doppler was utilized to evaluate flow at rest and with distal augmentation maneuvers in the common femoral, femoral and popliteal veins. COMPARISON:  None. FINDINGS: Contralateral Common Femoral Vein: Respiratory phasicity is normal and symmetric with the symptomatic side. No evidence of thrombus. Normal compressibility. Common Femoral Vein: No evidence of thrombus. Normal compressibility, respiratory phasicity and response to augmentation. Saphenofemoral Junction: No evidence of thrombus. Normal compressibility and flow on color Doppler imaging. Profunda Femoral Vein: No evidence of thrombus. Normal compressibility and flow on color Doppler imaging. Femoral Vein: No evidence of thrombus. Normal compressibility, respiratory phasicity and response to augmentation. Popliteal Vein: No evidence of thrombus. Normal compressibility, respiratory phasicity and response to augmentation. Calf Veins: Calf veins not well evaluated Superficial Great Saphenous Vein: No evidence of thrombus. Normal compressibility and flow on color Doppler imaging. Other Findings:  None. IMPRESSION: Sonographic survey of the left lower extremity negative for DVT Electronically Signed   By: Gilmer Mor D.O.   On: 11/04/2019 12:18    Procedures Procedures (including critical care time)  Medications Ordered in ED Medications - No  data to display  ED Course  I have reviewed the triage vital signs and the nursing notes.  Pertinent labs & imaging results that were available during my care of the patient were reviewed by me and considered in my medical decision making (see chart for details).  Clinical Course as of Nov 03 1725  Sat Nov 04, 2019  [id number] 69 year old with posterior knee pain x3 days.  Mechanism sounds like it is musculoskeletal possibly due to hamstring strain.  Will check a vascular ultrasound to make sure is not a DVT.  Also in the differential would be Baker's cyst.   [MB]  1227 Duplex of patient's right lower extremity did not show any obvious DVT.  Also no Baker's cyst identified.  Reviewed this with patient and she is comfortable with plan for NSAIDs and follow-up with her primary care doctor.   [MB]    Clinical Course User Index [MB] Terrilee Files, MD   MDM Rules/Calculators/A&P                       Final Clinical Impression(s) / ED Diagnoses Final diagnoses:  Hamstring muscle strain, left, initial encounter    Rx / DC Orders ED Discharge Orders    None       Terrilee Files, MD 11/04/19 1728

## 2019-11-04 NOTE — ED Notes (Signed)
Patient transported to Ultrasound 

## 2019-11-04 NOTE — ED Triage Notes (Signed)
Pt reports posterior left knee pain X3 days.

## 2019-11-04 NOTE — Discharge Instructions (Addendum)
You were seen in the emergency department for evaluation of pain behind your left knee.  You had an ultrasound that did not show any signs of blood clot.  This is likely a muscle strain and usually will respond to some anti-inflammatories such as ibuprofen 2 tablets 3 times a day with food on your stomach.  You can also use a warm compress to the area and do some gentle stretching.  Please follow-up with your primary care doctor and return if any worsening symptoms.

## 2020-01-24 DIAGNOSIS — Z1389 Encounter for screening for other disorder: Secondary | ICD-10-CM | POA: Diagnosis not present

## 2020-01-24 DIAGNOSIS — E039 Hypothyroidism, unspecified: Secondary | ICD-10-CM | POA: Diagnosis not present

## 2020-01-24 DIAGNOSIS — Z0000 Encounter for general adult medical examination without abnormal findings: Secondary | ICD-10-CM | POA: Diagnosis not present

## 2020-01-24 DIAGNOSIS — E7800 Pure hypercholesterolemia, unspecified: Secondary | ICD-10-CM | POA: Diagnosis not present

## 2020-01-24 DIAGNOSIS — E559 Vitamin D deficiency, unspecified: Secondary | ICD-10-CM | POA: Diagnosis not present

## 2020-01-24 DIAGNOSIS — I10 Essential (primary) hypertension: Secondary | ICD-10-CM | POA: Diagnosis not present

## 2020-01-24 DIAGNOSIS — E1169 Type 2 diabetes mellitus with other specified complication: Secondary | ICD-10-CM | POA: Diagnosis not present

## 2020-02-27 DIAGNOSIS — H25013 Cortical age-related cataract, bilateral: Secondary | ICD-10-CM | POA: Diagnosis not present

## 2020-02-27 DIAGNOSIS — H16223 Keratoconjunctivitis sicca, not specified as Sjogren's, bilateral: Secondary | ICD-10-CM | POA: Diagnosis not present

## 2020-02-27 DIAGNOSIS — H2513 Age-related nuclear cataract, bilateral: Secondary | ICD-10-CM | POA: Diagnosis not present

## 2020-03-12 DIAGNOSIS — E1169 Type 2 diabetes mellitus with other specified complication: Secondary | ICD-10-CM | POA: Diagnosis not present

## 2020-03-12 DIAGNOSIS — E039 Hypothyroidism, unspecified: Secondary | ICD-10-CM | POA: Diagnosis not present

## 2020-03-12 DIAGNOSIS — R450 Nervousness: Secondary | ICD-10-CM | POA: Diagnosis not present

## 2020-03-12 DIAGNOSIS — E1165 Type 2 diabetes mellitus with hyperglycemia: Secondary | ICD-10-CM | POA: Diagnosis not present

## 2020-05-06 DIAGNOSIS — E7800 Pure hypercholesterolemia, unspecified: Secondary | ICD-10-CM | POA: Diagnosis not present

## 2020-05-06 DIAGNOSIS — E1169 Type 2 diabetes mellitus with other specified complication: Secondary | ICD-10-CM | POA: Diagnosis not present

## 2020-05-06 DIAGNOSIS — E119 Type 2 diabetes mellitus without complications: Secondary | ICD-10-CM | POA: Diagnosis not present

## 2020-05-06 DIAGNOSIS — E039 Hypothyroidism, unspecified: Secondary | ICD-10-CM | POA: Diagnosis not present

## 2020-05-06 DIAGNOSIS — I10 Essential (primary) hypertension: Secondary | ICD-10-CM | POA: Diagnosis not present

## 2020-07-01 DIAGNOSIS — Z23 Encounter for immunization: Secondary | ICD-10-CM | POA: Diagnosis not present

## 2020-07-01 DIAGNOSIS — Z1231 Encounter for screening mammogram for malignant neoplasm of breast: Secondary | ICD-10-CM | POA: Diagnosis not present

## 2020-07-10 DIAGNOSIS — E039 Hypothyroidism, unspecified: Secondary | ICD-10-CM | POA: Diagnosis not present

## 2020-07-10 DIAGNOSIS — E1169 Type 2 diabetes mellitus with other specified complication: Secondary | ICD-10-CM | POA: Diagnosis not present

## 2020-07-10 DIAGNOSIS — I10 Essential (primary) hypertension: Secondary | ICD-10-CM | POA: Diagnosis not present

## 2020-07-10 DIAGNOSIS — E119 Type 2 diabetes mellitus without complications: Secondary | ICD-10-CM | POA: Diagnosis not present

## 2020-07-10 DIAGNOSIS — E7800 Pure hypercholesterolemia, unspecified: Secondary | ICD-10-CM | POA: Diagnosis not present

## 2020-07-25 DIAGNOSIS — Z7984 Long term (current) use of oral hypoglycemic drugs: Secondary | ICD-10-CM | POA: Diagnosis not present

## 2020-07-25 DIAGNOSIS — E039 Hypothyroidism, unspecified: Secondary | ICD-10-CM | POA: Diagnosis not present

## 2020-07-25 DIAGNOSIS — E1169 Type 2 diabetes mellitus with other specified complication: Secondary | ICD-10-CM | POA: Diagnosis not present

## 2020-07-25 DIAGNOSIS — R0989 Other specified symptoms and signs involving the circulatory and respiratory systems: Secondary | ICD-10-CM | POA: Diagnosis not present

## 2020-07-25 DIAGNOSIS — I10 Essential (primary) hypertension: Secondary | ICD-10-CM | POA: Diagnosis not present

## 2020-07-25 DIAGNOSIS — E7800 Pure hypercholesterolemia, unspecified: Secondary | ICD-10-CM | POA: Diagnosis not present

## 2020-08-09 DIAGNOSIS — Z23 Encounter for immunization: Secondary | ICD-10-CM | POA: Diagnosis not present

## 2020-08-15 DIAGNOSIS — Z1151 Encounter for screening for human papillomavirus (HPV): Secondary | ICD-10-CM | POA: Diagnosis not present

## 2020-08-15 DIAGNOSIS — Z90710 Acquired absence of both cervix and uterus: Secondary | ICD-10-CM | POA: Diagnosis not present

## 2020-08-15 DIAGNOSIS — Z79899 Other long term (current) drug therapy: Secondary | ICD-10-CM | POA: Diagnosis not present

## 2020-08-15 DIAGNOSIS — Z01411 Encounter for gynecological examination (general) (routine) with abnormal findings: Secondary | ICD-10-CM | POA: Diagnosis not present

## 2020-08-15 DIAGNOSIS — Z01419 Encounter for gynecological examination (general) (routine) without abnormal findings: Secondary | ICD-10-CM | POA: Diagnosis not present

## 2020-08-15 DIAGNOSIS — Z124 Encounter for screening for malignant neoplasm of cervix: Secondary | ICD-10-CM | POA: Diagnosis not present

## 2020-08-15 DIAGNOSIS — N952 Postmenopausal atrophic vaginitis: Secondary | ICD-10-CM | POA: Diagnosis not present

## 2020-08-26 DIAGNOSIS — H25013 Cortical age-related cataract, bilateral: Secondary | ICD-10-CM | POA: Diagnosis not present

## 2020-08-26 DIAGNOSIS — E119 Type 2 diabetes mellitus without complications: Secondary | ICD-10-CM | POA: Diagnosis not present

## 2020-08-26 DIAGNOSIS — H524 Presbyopia: Secondary | ICD-10-CM | POA: Diagnosis not present

## 2020-08-26 DIAGNOSIS — H52203 Unspecified astigmatism, bilateral: Secondary | ICD-10-CM | POA: Diagnosis not present

## 2020-08-26 DIAGNOSIS — H16223 Keratoconjunctivitis sicca, not specified as Sjogren's, bilateral: Secondary | ICD-10-CM | POA: Diagnosis not present

## 2020-08-26 DIAGNOSIS — H43813 Vitreous degeneration, bilateral: Secondary | ICD-10-CM | POA: Diagnosis not present

## 2020-08-26 DIAGNOSIS — Z7984 Long term (current) use of oral hypoglycemic drugs: Secondary | ICD-10-CM | POA: Diagnosis not present

## 2020-08-26 DIAGNOSIS — H2513 Age-related nuclear cataract, bilateral: Secondary | ICD-10-CM | POA: Diagnosis not present

## 2020-08-26 DIAGNOSIS — H5203 Hypermetropia, bilateral: Secondary | ICD-10-CM | POA: Diagnosis not present

## 2020-10-10 DIAGNOSIS — E1169 Type 2 diabetes mellitus with other specified complication: Secondary | ICD-10-CM | POA: Diagnosis not present

## 2020-10-10 DIAGNOSIS — E039 Hypothyroidism, unspecified: Secondary | ICD-10-CM | POA: Diagnosis not present

## 2020-10-10 DIAGNOSIS — E7800 Pure hypercholesterolemia, unspecified: Secondary | ICD-10-CM | POA: Diagnosis not present

## 2020-10-10 DIAGNOSIS — I10 Essential (primary) hypertension: Secondary | ICD-10-CM | POA: Diagnosis not present

## 2020-10-10 DIAGNOSIS — E119 Type 2 diabetes mellitus without complications: Secondary | ICD-10-CM | POA: Diagnosis not present

## 2021-02-24 DIAGNOSIS — Z23 Encounter for immunization: Secondary | ICD-10-CM | POA: Diagnosis not present

## 2021-02-25 DIAGNOSIS — H2513 Age-related nuclear cataract, bilateral: Secondary | ICD-10-CM | POA: Diagnosis not present

## 2021-02-25 DIAGNOSIS — H524 Presbyopia: Secondary | ICD-10-CM | POA: Diagnosis not present

## 2021-02-25 DIAGNOSIS — H5203 Hypermetropia, bilateral: Secondary | ICD-10-CM | POA: Diagnosis not present

## 2021-02-25 DIAGNOSIS — H52203 Unspecified astigmatism, bilateral: Secondary | ICD-10-CM | POA: Diagnosis not present

## 2021-02-25 DIAGNOSIS — H16223 Keratoconjunctivitis sicca, not specified as Sjogren's, bilateral: Secondary | ICD-10-CM | POA: Diagnosis not present

## 2021-02-25 DIAGNOSIS — H25013 Cortical age-related cataract, bilateral: Secondary | ICD-10-CM | POA: Diagnosis not present

## 2021-02-27 DIAGNOSIS — Z1389 Encounter for screening for other disorder: Secondary | ICD-10-CM | POA: Diagnosis not present

## 2021-02-27 DIAGNOSIS — E7800 Pure hypercholesterolemia, unspecified: Secondary | ICD-10-CM | POA: Diagnosis not present

## 2021-02-27 DIAGNOSIS — E039 Hypothyroidism, unspecified: Secondary | ICD-10-CM | POA: Diagnosis not present

## 2021-02-27 DIAGNOSIS — E559 Vitamin D deficiency, unspecified: Secondary | ICD-10-CM | POA: Diagnosis not present

## 2021-02-27 DIAGNOSIS — E1169 Type 2 diabetes mellitus with other specified complication: Secondary | ICD-10-CM | POA: Diagnosis not present

## 2021-02-27 DIAGNOSIS — Z79899 Other long term (current) drug therapy: Secondary | ICD-10-CM | POA: Diagnosis not present

## 2021-02-27 DIAGNOSIS — Z7984 Long term (current) use of oral hypoglycemic drugs: Secondary | ICD-10-CM | POA: Diagnosis not present

## 2021-02-27 DIAGNOSIS — Z0000 Encounter for general adult medical examination without abnormal findings: Secondary | ICD-10-CM | POA: Diagnosis not present

## 2021-02-27 DIAGNOSIS — I10 Essential (primary) hypertension: Secondary | ICD-10-CM | POA: Diagnosis not present

## 2021-04-15 DIAGNOSIS — E039 Hypothyroidism, unspecified: Secondary | ICD-10-CM | POA: Diagnosis not present

## 2021-04-15 DIAGNOSIS — E1169 Type 2 diabetes mellitus with other specified complication: Secondary | ICD-10-CM | POA: Diagnosis not present

## 2021-04-15 DIAGNOSIS — E7800 Pure hypercholesterolemia, unspecified: Secondary | ICD-10-CM | POA: Diagnosis not present

## 2021-04-15 DIAGNOSIS — I10 Essential (primary) hypertension: Secondary | ICD-10-CM | POA: Diagnosis not present

## 2021-04-15 DIAGNOSIS — E119 Type 2 diabetes mellitus without complications: Secondary | ICD-10-CM | POA: Diagnosis not present

## 2021-06-16 ENCOUNTER — Ambulatory Visit
Admission: EM | Admit: 2021-06-16 | Discharge: 2021-06-16 | Disposition: A | Discharge status: Home / Self Care | Payer: Medicare Other | Attending: Physician Assistant | Admitting: Physician Assistant

## 2021-06-16 ENCOUNTER — Other Ambulatory Visit: Payer: Self-pay

## 2021-06-16 DIAGNOSIS — L309 Dermatitis, unspecified: Secondary | ICD-10-CM

## 2021-06-16 DIAGNOSIS — R21 Rash and other nonspecific skin eruption: Secondary | ICD-10-CM | POA: Diagnosis not present

## 2021-06-16 MED ORDER — TRIAMCINOLONE ACETONIDE 0.1 % EX CREA: 1.0000 "application " | TOPICAL_CREAM | Freq: Two times a day (BID) | CUTANEOUS | 0 refills | Status: DC

## 2021-06-16 NOTE — ED Triage Notes (Signed)
Pt reports having rash to right side of neck, does not report coming in contact with anything. Patient states area itches at times. Started: 5 days ago

## 2021-06-16 NOTE — Discharge Instructions (Addendum)
Use Kenalog cream twice daily.  Make sure you keep area clean and moisturized with hypoallergenic lotion that does not include a scent or dye.  Use hypoallergenic soaps and detergents.  If anything worsens and this continues to spread or becomes more uncomfortable you should be seen again.  If your symptoms do not improve with this medication you may need to see a dermatologist so please follow-up with your primary care provider if symptoms do not quickly improve.  If you have any sudden severe symptoms including widespread rash, fever, nausea, vomiting you need to go to the emergency room.

## 2021-06-16 NOTE — ED Provider Notes (Signed)
[facility]    CSN: [id number] Arrival date & time: 06/16/21  0931      History   Chief Complaint Chief Complaint  Patient presents with   Rash    HPI Kathryn Reed is a 70 y.o. female.   Patient presents today with a 5-day history of pruritic rash on the right side of her neck.  She has been applying Neosporin and moisturizing cream with temporary improvement of symptoms.  Reports that symptoms become intensely pruritic if they become dry she has been applying medication regularly.  She denies any spread or increase in rash.  Denies any changes to her personal hygiene products including soaps or detergents.  Denies any medication changes.  She denies systemic symptoms including fever, headache, nausea, vomiting, dizziness.  She denies history of dermatological condition including eczema or psoriasis.  Denies any household sick contacts with similar symptoms or exposure to insects or plants.  She is anxious to feel better as she is scheduled to go out of town in a few days.  She has had similar episodes in the past that resolved with topical steroids.   Past Medical History:  Diagnosis Date   Allergy    Hypertension    Pre-diabetes    diet controlled   Thyroid disease     Patient Active Problem List   Diagnosis Date Noted   Right knee pain 08/23/2017   Posterior vitreous detachment of both eyes 05/28/2017   Hypermetropia of both eyes 05/28/2017   Diabetes mellitus without complication (HCC) 05/28/2017   Cortical age-related cataract of both eyes 05/28/2017   Nuclear sclerotic cataract of both eyes 11/27/2016   Thyroid nodule 04/09/2016   Presbyopia 07/19/2014   Low grade squamous intraepithelial lesion (LGSIL) on cervical Pap smear 07/19/2014   Keratoconjunctivitis sicca, not specified as Sjogren's 07/19/2014   Dermatophytosis, nail 07/19/2014   Pain in finger of right hand 08/17/2013   Hypothyroid 01/26/2011   Hypertension 01/26/2011    Past Surgical  History:  Procedure Laterality Date   ABDOMINAL HYSTERECTOMY  1987   1 OVARY REMOVED   COLONOSCOPY  07/20/2006   DB   MYOMECTOMY     30+ yrs ago    THYROID SURGERY  15 YRS AGO   LEFT SIDE   THYROIDECTOMY Right 04/09/2016   Procedure: COMPLETION THYROIDECTOMY;  Surgeon: Avel Peace, MD;  Location: [facility];  Service: General;  Laterality: Right;    OB History   No obstetric history on file.      Home Medications    Prior to Admission medications   Medication Sig Start Date End Date Taking? Authorizing Provider  triamcinolone cream (KENALOG) 0.1 % Apply 1 application topically 2 (two) times daily. 06/16/21  Yes Shirlie Enck, Noberto Retort, PA-C  acetaminophen (TYLENOL) 325 MG tablet Take 650 mg by mouth every 6 (six) hours as needed.    [provider]  amLODipine (NORVASC) 5 MG tablet Take 5 mg by mouth daily.   11/27/10   [provider]  atorvastatin (LIPITOR) 20 MG tablet  07/04/19   [provider]  cholecalciferol (VITAMIN D) 1000 units tablet Take 3,000 Units by mouth daily.    [provider]  conjugated estrogens (PREMARIN) vaginal cream Insert 1/4 applicator vaginally q HS 2wk prior to appt.  Stop using it 2d prior to appt. 07/05/17   [provider]  fluticasone (FLONASE) 50 MCG/ACT nasal spray 2 sprays. PRN 12/08/14   [provider]  glimepiride (AMARYL) 2 MG tablet  12/31/17   [provider]  losartan-hydrochlorothiazide (HYZAAR) 100-25 MG per tablet Take 1 tablet by mouth daily.   11/27/10   [provider]  rosuvastatin (CRESTOR) 5 MG tablet  12/31/17   [provider]  SYNTHROID 137 MCG tablet  01/27/18   [provider]    Family History Family History  Problem Relation Age of Onset   Heart attack Mother    Hypertension Mother    Heart attack Father    Hypertension Father    Heart attack Sister    Diabetes Sister    Hypertension Sister    Hyperlipidemia Brother    Sudden death Neg Hx     Colon cancer Neg Hx    Colon polyps Neg Hx    Esophageal cancer Neg Hx    Rectal cancer Neg Hx    Stomach cancer Neg Hx    Breast cancer Neg Hx     Social History Social History   Tobacco Use   Smoking status: Former    Packs/day: 0.50    Years: 10.00    Pack years: 5.00    Types: Cigarettes    Quit date: 10/13/1995    Years since quitting: 25.6   Smokeless tobacco: Never  Substance Use Topics   Alcohol use: No   Drug use: No     Allergies   Sulfa antibiotics and Metformin   Review of Systems Review of Systems  Constitutional:  Negative for activity change, appetite change, fatigue and fever.  Respiratory:  Negative for cough and shortness of breath.   Cardiovascular:  Negative for chest pain.  Gastrointestinal:  Negative for abdominal pain, diarrhea, nausea and vomiting.  Skin:  Positive for rash.  Neurological:  Negative for dizziness, light-headedness and headaches.    Physical Exam Triage Vital Signs ED Triage Vitals  Enc Vitals Group     BP 06/16/21 0957 (!) 144/82     Pulse Rate 06/16/21 0957 66     Resp 06/16/21 0957 20     Temp 06/16/21 0957 98.4 F (36.9 C)     Temp Source 06/16/21 0957 Oral     SpO2 06/16/21 0957 98 %     Weight --      Height --      Head Circumference --      Peak Flow --      Pain Score 06/16/21 0955 0     Pain Loc --      Pain Edu? --      Excl. in GC? --    No data found.  Updated Vital Signs BP (!) 144/82 (BP Location: Right Arm)   Pulse 66   Temp 98.4 F (36.9 C) (Oral)   Resp 20   SpO2 98%   Visual Acuity Right Eye Distance:   Left Eye Distance:   Bilateral Distance:    Right Eye Near:   Left Eye Near:    Bilateral Near:     Physical Exam Vitals reviewed.  Constitutional:      General: She is awake. She is not in acute distress.    Appearance: Normal appearance. She is normal weight. She is not ill-appearing.     Comments: Very pleasant female appears stated age no acute distress sitting  comfortably in exam room  HENT:     Head: Normocephalic and atraumatic.  Cardiovascular:     Rate and Rhythm: Normal rate and regular rhythm.     Heart sounds: Normal heart sounds, S1 normal and S2  normal. No murmur heard. Pulmonary:     Effort: Pulmonary effort is normal.     Breath sounds: Normal breath sounds. No wheezing, rhonchi or rales.     Comments: Clear to auscultation bilaterally Skin:    Findings: Rash present. Rash is macular and papular.          Comments: 5 cm x 10 cm area of hyperpigmented maculopapular rash noted right lateral neck.  No wounds or lesions noted.  No streaking or evidence of lymphangitis.  Evidence of excoriation noted at central rash.  Psychiatric:        Behavior: Behavior is cooperative.     UC Treatments / Results  Labs (all labs ordered are listed, but only abnormal results are displayed) Labs Reviewed - No data to display  EKG   Radiology No results found.  Procedures Procedures (including critical care time)  Medications Ordered in UC Medications - No data to display  Initial Impression / Assessment and Plan / UC Course  I have reviewed the triage vital signs and the nursing notes.  Pertinent labs & imaging results that were available during my care of the patient were reviewed by me and considered in my medical decision making (see chart for details).      Patient was instructed to apply Kenalog cream twice daily to help manage symptoms.  Recommended that she use hypoallergenic soaps and detergents and ensure area remains and moisturized.  Discussed that if she has any worsening symptoms including systemic symptoms she needs to be reevaluated immediately.  Discussed alarm symptoms that warrant reevaluation.  Recommended she follow-up with primary care provider if symptoms do not resolve within 1 week with medication to consider referral to dermatology.  Strict return precautions given to which patient expressed  understanding.  Final Clinical Impressions(s) / UC Diagnoses   Final diagnoses:  Dermatitis  Rash and nonspecific skin eruption     Discharge Instructions      Use Kenalog cream twice daily.  Make sure you keep area clean and moisturized with hypoallergenic lotion that does not include a scent or dye.  Use hypoallergenic soaps and detergents.  If anything worsens and this continues to spread or becomes more uncomfortable you should be seen again.  If your symptoms do not improve with this medication you may need to see a dermatologist so please follow-up with your primary care provider if symptoms do not quickly improve.  If you have any sudden severe symptoms including widespread rash, fever, nausea, vomiting you need to go to the emergency room.     ED Prescriptions     Medication Sig Dispense Auth. Provider   triamcinolone cream (KENALOG) 0.1 % Apply 1 application topically 2 (two) times daily. 30 g Lynleigh Kovack, Noberto Retort, PA-C      PDMP not reviewed this encounter.   Jeani Hawking, PA-C 06/16/21 1021

## 2021-07-02 DIAGNOSIS — Z1231 Encounter for screening mammogram for malignant neoplasm of breast: Secondary | ICD-10-CM | POA: Diagnosis not present

## 2021-07-08 DIAGNOSIS — Z23 Encounter for immunization: Secondary | ICD-10-CM | POA: Diagnosis not present

## 2021-08-07 ENCOUNTER — Ambulatory Visit: Payer: Medicare Other | Attending: Internal Medicine

## 2021-08-07 DIAGNOSIS — Z23 Encounter for immunization: Secondary | ICD-10-CM

## 2021-08-07 NOTE — Progress Notes (Signed)
   Covid-19 Vaccination Clinic  Name:  Kathryn Reed    MRN: [id number] DOB: [birth year]  08/07/2021  [name] was observed post Covid-19 immunization for 15 minutes without incident. She was provided with Vaccine Information Sheet and instruction to access the V-Safe system.   [name] was instructed to call 911 with any severe reactions post vaccine: Difficulty breathing  Swelling of face and throat  A fast heartbeat  A bad rash all over body  Dizziness and weakness   Immunizations Administered     Name Date Dose VIS Date Route   Pfizer Covid-19 Vaccine Bivalent Booster 08/07/2021  1:40 PM 0.3 mL 06/11/2021 Intramuscular   Manufacturer: [employer], [employer]   Lot: [id number]   NDC: [phone]

## 2021-08-18 DIAGNOSIS — N952 Postmenopausal atrophic vaginitis: Secondary | ICD-10-CM | POA: Diagnosis not present

## 2021-08-21 ENCOUNTER — Other Ambulatory Visit: Payer: Self-pay

## 2021-08-21 ENCOUNTER — Ambulatory Visit
Admission: EM | Admit: 2021-08-21 | Discharge: 2021-08-21 | Disposition: A | Discharge status: Home / Self Care | Payer: Medicare Other | Attending: Emergency Medicine | Admitting: Emergency Medicine

## 2021-08-21 DIAGNOSIS — J019 Acute sinusitis, unspecified: Secondary | ICD-10-CM

## 2021-08-21 DIAGNOSIS — B3731 Acute candidiasis of vulva and vagina: Secondary | ICD-10-CM

## 2021-08-21 DIAGNOSIS — K1121 Acute sialoadenitis: Secondary | ICD-10-CM | POA: Diagnosis not present

## 2021-08-21 DIAGNOSIS — J00 Acute nasopharyngitis [common cold]: Secondary | ICD-10-CM

## 2021-08-21 DIAGNOSIS — H6641 Suppurative otitis media, unspecified, right ear: Secondary | ICD-10-CM

## 2021-08-21 DIAGNOSIS — B9689 Other specified bacterial agents as the cause of diseases classified elsewhere: Secondary | ICD-10-CM | POA: Diagnosis not present

## 2021-08-21 MED ORDER — FLUCONAZOLE 150 MG PO TABS: ORAL_TABLET | ORAL | 0 refills | Status: DC

## 2021-08-21 MED ORDER — IPRATROPIUM BROMIDE 0.03 % NA SOLN
2.0000 | Freq: Two times a day (BID) | NASAL | 0 refills | Status: DC
Start: 2021-08-21 — End: 2021-10-09

## 2021-08-21 MED ORDER — AMOXICILLIN-POT CLAVULANATE 875-125 MG PO TABS: 1.0000 | ORAL_TABLET | Freq: Two times a day (BID) | ORAL | 0 refills | Status: AC

## 2021-08-21 NOTE — Discharge Instructions (Addendum)
Based on the rapid onset of your symptoms, the history provided for me today and my physical exam findings, I believe that you have acquired a bacterial infection initially in your sinus that has traveled to your eardrum as well as one of your salivary glands which is causing significant swelling in your throat, some pressure in your ear and a sensation of pressure in your sinuses.  To treat this, I recommend that you begin Augmentin, 1 tablet twice daily for the next 10 days.  This is a very strong antibiotic and covers bacteria that can be aggressive and cause serious infection and soft tissue.  Unfortunately, it also kills the good bacteria that lives in your body that keeps things like yeast under control.  For this reason, is very likely you will develop a vaginal yeast infection after taking Augmentin.  I have prescribed an antiyeast medication called Diflucan that I recommend you begin taking on day 5 of your Augmentin treatment.  Please take the second tablet on day 8.  Finally, I have sent a prescription for nasal spray called Atrovent to address the significant swelling in both sides of your nose and to dry up the clear sinus drainage as well.  Please spray 2 sprays into each nare 4 times daily as needed.  If you have not had significant improvement of your symptoms within the next 3 to 5 days, please return here or to your primary care provider for repeat evaluation.  Thank you for visiting urgent care today.  I hope you feel better soon.  I also hope you that you enjoy your upcoming vacation.

## 2021-08-21 NOTE — ED Triage Notes (Signed)
Pt reports having swelling to her throat with some tenderness. Pt states she believes it's her ear (right) causing the problem. Pt denies SOB.

## 2021-08-21 NOTE — ED Provider Notes (Signed)
[facility]    CSN: [id number] Arrival date & time: 08/21/21  4098      History   Chief Complaint No chief complaint on file.   HPI Kathryn Reed is a 70 y.o. female.   Patient states has been working in her yard this week, pulling up all bushes, raking of leaves and cleaning up various items from the yard to throw them away.  Patient states she had help doing this work but forgot to wear a mask while she was doing it.  Patient states she feels like she is breathing a lot of dust and has noticed that her nose has been running more in the past few days.  Patient states her nasal drainage is clear.  Patient states that yesterday she began to notice some pressure in her right maxillary sinus as well as a sensation of fullness in her right ear.  Patient states she also noticed that her right eye was tearing more than usual and her left eye was not, particularly when she was watching television last night.  Patient states when she woke up this morning she has noticed significant swelling of the right side of her neck.  Patient denies pain in her neck, maxillary sinus, right ear.  Patient states that she is not having any shortness of breath patient states she has not had any fever, aches, chills.  Patient was apprised to see that her temperature was elevated today, states her body temperature usually runs around 97 degrees so she feels that 99.5 F is a fever for her.  Patient denies cough, headache, dizziness, vision changes, loss of hearing, nausea, vomiting, diarrhea.  Patient also has a mildly elevated blood pressure on arrival today.  Patient reports she has type 2 diabetes but that it is well controlled.  The history is provided by the patient.   Past Medical History:  Diagnosis Date   Allergy    Hypertension    Pre-diabetes    diet controlled   Thyroid disease     Patient Active Problem List   Diagnosis Date Noted   Right knee pain 08/23/2017   Posterior  vitreous detachment of both eyes 05/28/2017   Hypermetropia of both eyes 05/28/2017   Diabetes mellitus without complication (HCC) 05/28/2017   Cortical age-related cataract of both eyes 05/28/2017   Nuclear sclerotic cataract of both eyes 11/27/2016   Thyroid nodule 04/09/2016   Presbyopia 07/19/2014   Low grade squamous intraepithelial lesion (LGSIL) on cervical Pap smear 07/19/2014   Keratoconjunctivitis sicca, not specified as Sjogren's 07/19/2014   Dermatophytosis, nail 07/19/2014   Pain in finger of right hand 08/17/2013   Hypothyroid 01/26/2011   Hypertension 01/26/2011    Past Surgical History:  Procedure Laterality Date   ABDOMINAL HYSTERECTOMY  1987   1 OVARY REMOVED   COLONOSCOPY  07/20/2006   DB   MYOMECTOMY     30+ yrs ago    THYROID SURGERY  15 YRS AGO   LEFT SIDE   THYROIDECTOMY Right 04/09/2016   Procedure: COMPLETION THYROIDECTOMY;  Surgeon: Avel Peace, MD;  Location: [facility];  Service: General;  Laterality: Right;    OB History   No obstetric history on file.      Home Medications    Prior to Admission medications   Medication Sig Start Date End Date Taking? Authorizing Provider  amoxicillin-clavulanate (AUGMENTIN) 875-125 MG tablet Take 1 tablet by mouth every 12 (twelve) hours for 10 days. 08/21/21 08/31/21 Yes Theadora Rama Scales, PA-C  fluconazole (DIFLUCAN) 150 MG tablet Take 1 tablet on day 5 of antibiotics.  Take second tablet 3 days later. 08/21/21  Yes Theadora Rama Scales, PA-C  ipratropium (ATROVENT) 0.03 % nasal spray Place 2 sprays into both nostrils every 12 (twelve) hours. 08/21/21  Yes Theadora Rama Scales, PA-C  acetaminophen (TYLENOL) 325 MG tablet Take 650 mg by mouth every 6 (six) hours as needed.    [provider]  amLODipine (NORVASC) 5 MG tablet Take 5 mg by mouth daily.   11/27/10   [provider]  atorvastatin (LIPITOR) 20 MG tablet  07/04/19   [provider]  cholecalciferol (VITAMIN D) 1000  units tablet Take 3,000 Units by mouth daily.    [provider]  conjugated estrogens (PREMARIN) vaginal cream Insert 1/4 applicator vaginally q HS 2wk prior to appt.  Stop using it 2d prior to appt. 07/05/17   [provider]  fluticasone (FLONASE) 50 MCG/ACT nasal spray 2 sprays. PRN 12/08/14   [provider]  glimepiride (AMARYL) 2 MG tablet  12/31/17   [provider]  losartan-hydrochlorothiazide (HYZAAR) 100-25 MG per tablet Take 1 tablet by mouth daily.   11/27/10   [provider]  SYNTHROID 137 MCG tablet  01/27/18   [provider]  triamcinolone cream (KENALOG) 0.1 % Apply 1 application topically 2 (two) times daily. 06/16/21   Raspet, Noberto Retort, PA-C    Family History Family History  Problem Relation Age of Onset   Heart attack Mother    Hypertension Mother    Heart attack Father    Hypertension Father    Heart attack Sister    Diabetes Sister    Hypertension Sister    Hyperlipidemia Brother    Sudden death Neg Hx    Colon cancer Neg Hx    Colon polyps Neg Hx    Esophageal cancer Neg Hx    Rectal cancer Neg Hx    Stomach cancer Neg Hx    Breast cancer Neg Hx     Social History Social History   Tobacco Use   Smoking status: Former    Packs/day: 0.50    Years: 10.00    Pack years: 5.00    Types: Cigarettes    Quit date: 10/13/1995    Years since quitting: 25.8   Smokeless tobacco: Never  Substance Use Topics   Alcohol use: No   Drug use: No     Allergies   Sulfa antibiotics, Metformin, and Rosuvastatin   Review of Systems Review of Systems Pertinent findings noted in history of present illness.    Physical Exam Triage Vital Signs ED Triage Vitals  Enc Vitals Group     BP 08/08/21 0827 (!) 147/82     Pulse Rate 08/08/21 0827 72     Resp 08/08/21 0827 18     Temp 08/08/21 0827 98.3 F (36.8 C)     Temp Source 08/08/21 0827 Oral     SpO2 08/08/21 0827 98 %     Weight --      Height --      Head  Circumference --      Peak Flow --      Pain Score 08/08/21 0826 5     Pain Loc --      Pain Edu? --      Excl. in GC? --    No data found.  Updated Vital Signs BP (!) 142/77 (BP Location: Left Arm)   Pulse 70   Temp  99.5 F (37.5 C) (Oral)   Resp 20   SpO2 98%   Visual Acuity Right Eye Distance:   Left Eye Distance:   Bilateral Distance:    Right Eye Near:   Left Eye Near:    Bilateral Near:     Physical Exam Vitals and nursing note reviewed.  Constitutional:      General: She is not in acute distress.    Appearance: Normal appearance. She is not ill-appearing.  HENT:     Head: Normocephalic and atraumatic.     Jaw: There is normal jaw occlusion.     Salivary Glands: Right salivary gland is diffusely enlarged (Right significantly greater than left). Right salivary gland is not tender. Left salivary gland is diffusely enlarged. Left salivary gland is not tender.     Right Ear: Ear canal and external ear normal. No drainage. A middle ear effusion is present. There is no impacted cerumen. Tympanic membrane is erythematous and bulging.     Left Ear: Tympanic membrane, ear canal and external ear normal. No drainage.  No middle ear effusion. There is no impacted cerumen. Tympanic membrane is not erythematous or bulging.     Ears:     Comments: Right ear bulging with suppurative fluid, right TM is erythematous    Nose: Mucosal edema, congestion and rhinorrhea present. No nasal deformity or septal deviation. Rhinorrhea is clear.     Right Turbinates: Enlarged and swollen. Not pale.     Left Turbinates: Enlarged and swollen. Not pale.     Right Sinus: Maxillary sinus tenderness present. No frontal sinus tenderness.     Left Sinus: No maxillary sinus tenderness or frontal sinus tenderness.     Mouth/Throat:     Lips: Pink. No lesions.     Mouth: Mucous membranes are moist. No oral lesions.     Pharynx: Oropharynx is clear. No posterior oropharyngeal erythema or uvula swelling.      Tonsils: No tonsillar exudate. 1+ on the right. 0 on the left.  Eyes:     General: Lids are normal.        Right eye: No discharge.        Left eye: No discharge.     Extraocular Movements: Extraocular movements intact.     Conjunctiva/sclera: Conjunctivae normal.     Right eye: Right conjunctiva is not injected.     Left eye: Left conjunctiva is not injected.  Neck:     Trachea: Trachea and phonation normal.  Cardiovascular:     Rate and Rhythm: Normal rate and regular rhythm.     Pulses: Normal pulses.     Heart sounds: Normal heart sounds. No murmur heard.   No friction rub. No gallop.  Pulmonary:     Effort: Pulmonary effort is normal. No accessory muscle usage, prolonged expiration or respiratory distress.     Breath sounds: Normal breath sounds. No stridor, decreased air movement or transmitted upper airway sounds. No decreased breath sounds, wheezing, rhonchi or rales.  Chest:     Chest wall: No tenderness.  Musculoskeletal:        General: Normal range of motion.     Cervical back: Normal range of motion and neck supple. Normal range of motion.  Lymphadenopathy:     Cervical: Cervical adenopathy present.     Right cervical: Superficial cervical adenopathy, deep cervical adenopathy and posterior cervical adenopathy present.  Skin:    General: Skin is warm and dry.     Findings: No erythema or  rash.  Neurological:     General: No focal deficit present.     Mental Status: She is alert and oriented to person, place, and time.  Psychiatric:        Mood and Affect: Mood normal.        Behavior: Behavior normal.     UC Treatments / Results  Labs (all labs ordered are listed, but only abnormal results are displayed) Labs Reviewed - No data to display  EKG   Radiology No results found.  Procedures Procedures (including critical care time)  Medications Ordered in UC Medications - No data to display  Initial Impression / Assessment and Plan / UC Course  I  have reviewed the triage vital signs and the nursing notes.  Pertinent labs & imaging results that were available during my care of the patient were reviewed by me and considered in my medical decision making (see chart for details).   We will treat patient empirically with antibiotics for presumed otitis media, sialoadenitis and maxillary sinusitis.  Patient also provided with a prescription for ipratropium nasal spray to help reduce her symptoms of rhinitis and congestion and a prescription for Diflucan given the likelihood that is a type II diabetic, she will develop a yeast infection while taking Augmentin.  Patient verbalized understanding and agreement of plan as discussed.  All questions were addressed during visit.  Please see discharge instructions below for further details of plan.  Final Clinical Impressions(s) / UC Diagnoses   Final diagnoses:  Suppurative otitis media without rupture of ear drum, right  Acute bacterial sinusitis  Acute bacterial sialadenitis  Vaginal candida  Acute rhinitis     Discharge Instructions      Based on the rapid onset of your symptoms, the history provided for me today and my physical exam findings, I believe that you have acquired a bacterial infection initially in your sinus that has traveled to your eardrum as well as one of your salivary glands which is causing significant swelling in your throat, some pressure in your ear and a sensation of pressure in your sinuses.  To treat this, I recommend that you begin Augmentin, 1 tablet twice daily for the next 10 days.  This is a very strong antibiotic and covers bacteria that can be aggressive and cause serious infection and soft tissue.  Unfortunately, it also kills the good bacteria that lives in your body that keeps things like yeast under control.  For this reason, is very likely you will develop a vaginal yeast infection after taking Augmentin.  I have prescribed an antiyeast medication called  Diflucan that I recommend you begin taking on day 5 of your Augmentin treatment.  Please take the second tablet on day 8.  Finally, I have sent a prescription for nasal spray called Atrovent to address the significant swelling in both sides of your nose and to dry up the clear sinus drainage as well.  Please spray 2 sprays into each nare 4 times daily as needed.  If you have not had significant improvement of your symptoms within the next 3 to 5 days, please return here or to your primary care provider for repeat evaluation.  Thank you for visiting urgent care today.  I hope you feel better soon.  I also hope you that you enjoy your upcoming vacation.     ED Prescriptions     Medication Sig Dispense Auth. Provider   amoxicillin-clavulanate (AUGMENTIN) 875-125 MG tablet Take 1 tablet by mouth every 12 (  twelve) hours for 10 days. 20 tablet Theadora Rama Scales, PA-C   fluconazole (DIFLUCAN) 150 MG tablet Take 1 tablet on day 5 of antibiotics.  Take second tablet 3 days later. 2 tablet Theadora Rama Scales, PA-C   ipratropium (ATROVENT) 0.03 % nasal spray Place 2 sprays into both nostrils every 12 (twelve) hours. 30 mL Theadora Rama Scales, PA-C      PDMP not reviewed this encounter.   Disposition Upon Discharge:   Patient presented with an acute illness with associated systemic symptoms and significant discomfort requiring urgent management. In my opinion, this is a condition that a prudent lay person (someone who possesses an average knowledge of health and medicine) may potentially expect to result in complications if not addressed urgently such as respiratory distress, impairment of bodily function or dysfunction of bodily organs.   Routine symptom specific, illness specific and/or disease specific instructions were discussed with the patient and/or caregiver at length.   As such, the patient has been evaluated and assessed, work-up was performed and treatment was provided in  alignment with urgent care protocols and evidence based medicine.  Patient/parent/caregiver has been advised that the patient may require follow up for further testing and treatment if the symptoms continue in spite of treatment, as clinically indicated and appropriate.  If patient was tested for COVID-19, Influenza and/or RSV, the patient/parent/guardian was advised to isolate at home pending the results of his/her diagnostic coronavirus test and potentially longer if they're positive. Advised pt that if his/her COVID-19 test returns positive, it's recommended to self-isolate for at least 10 days after symptoms first appeared AND until fever-free for 24 hours without fever reducer AND other symptoms have improved or resolved. Discussed self-isolation recommendations as well as instructions for household member/close contacts as per the [facility] and [place] [facility], and also gave patient the COVID packet with this information.  Patient/parent/caregiver has been advised to return to the [facility] or PCP in 3-5 days if no better; to PCP or the Emergency Department if new signs and symptoms develop, or if the current signs or symptoms continue to change or worsen for further workup, evaluation and treatment as clinically indicated and appropriate  The patient will follow up with their current PCP if and as advised. If the patient does not currently have a PCP we will assist them in obtaining one.   The patient may need specialty follow up if the symptoms continue, in spite of conservative treatment and management, for further workup, evaluation, consultation and treatment as clinically indicated and appropriate.  Condition: stable for discharge home Home: take medications as prescribed; routine discharge instructions as discussed; follow up as advised.     Theadora Rama Scales, PA-C 08/21/21 1419

## 2021-08-25 DIAGNOSIS — E7800 Pure hypercholesterolemia, unspecified: Secondary | ICD-10-CM | POA: Diagnosis not present

## 2021-08-25 DIAGNOSIS — E039 Hypothyroidism, unspecified: Secondary | ICD-10-CM | POA: Diagnosis not present

## 2021-08-25 DIAGNOSIS — E1169 Type 2 diabetes mellitus with other specified complication: Secondary | ICD-10-CM | POA: Diagnosis not present

## 2021-08-25 DIAGNOSIS — I10 Essential (primary) hypertension: Secondary | ICD-10-CM | POA: Diagnosis not present

## 2021-08-25 DIAGNOSIS — Z7984 Long term (current) use of oral hypoglycemic drugs: Secondary | ICD-10-CM | POA: Diagnosis not present

## 2021-09-01 ENCOUNTER — Other Ambulatory Visit (HOSPITAL_BASED_OUTPATIENT_CLINIC_OR_DEPARTMENT_OTHER): Payer: Self-pay

## 2021-09-01 DIAGNOSIS — Z23 Encounter for immunization: Secondary | ICD-10-CM | POA: Diagnosis not present

## 2021-09-01 MED ORDER — PFIZER COVID-19 VAC BIVALENT 30 MCG/0.3ML IM SUSP
INTRAMUSCULAR | 0 refills | Status: AC
  Filled 2021-09-01: qty 0.3, 1d supply, fill #0

## 2021-10-07 DIAGNOSIS — E119 Type 2 diabetes mellitus without complications: Secondary | ICD-10-CM | POA: Diagnosis not present

## 2021-10-07 DIAGNOSIS — H25013 Cortical age-related cataract, bilateral: Secondary | ICD-10-CM | POA: Diagnosis not present

## 2021-10-07 DIAGNOSIS — H524 Presbyopia: Secondary | ICD-10-CM | POA: Diagnosis not present

## 2021-10-07 DIAGNOSIS — H43813 Vitreous degeneration, bilateral: Secondary | ICD-10-CM | POA: Diagnosis not present

## 2021-10-07 DIAGNOSIS — H5203 Hypermetropia, bilateral: Secondary | ICD-10-CM | POA: Diagnosis not present

## 2021-10-07 DIAGNOSIS — H2513 Age-related nuclear cataract, bilateral: Secondary | ICD-10-CM | POA: Diagnosis not present

## 2021-10-07 DIAGNOSIS — H52203 Unspecified astigmatism, bilateral: Secondary | ICD-10-CM | POA: Diagnosis not present

## 2021-10-07 DIAGNOSIS — H16223 Keratoconjunctivitis sicca, not specified as Sjogren's, bilateral: Secondary | ICD-10-CM | POA: Diagnosis not present

## 2021-10-07 DIAGNOSIS — Z7984 Long term (current) use of oral hypoglycemic drugs: Secondary | ICD-10-CM | POA: Diagnosis not present

## 2021-10-09 ENCOUNTER — Ambulatory Visit
Admission: EM | Admit: 2021-10-09 | Discharge: 2021-10-09 | Disposition: A | Discharge status: Home / Self Care | Payer: Medicare Other | Attending: Emergency Medicine | Admitting: Emergency Medicine

## 2021-10-09 ENCOUNTER — Other Ambulatory Visit: Payer: Self-pay

## 2021-10-09 DIAGNOSIS — R509 Fever, unspecified: Secondary | ICD-10-CM

## 2021-10-09 DIAGNOSIS — J019 Acute sinusitis, unspecified: Secondary | ICD-10-CM

## 2021-10-09 DIAGNOSIS — J069 Acute upper respiratory infection, unspecified: Secondary | ICD-10-CM

## 2021-10-09 MED ORDER — IPRATROPIUM BROMIDE 0.06 % NA SOLN: 2.0000 | Freq: Four times a day (QID) | NASAL | 0 refills | Status: AC

## 2021-10-09 MED ORDER — IBUPROFEN 400 MG PO TABS: 400.0000 mg | ORAL_TABLET | Freq: Three times a day (TID) | ORAL | 0 refills | Status: AC | PRN

## 2021-10-09 MED ORDER — GUAIFENESIN 400 MG PO TABS: ORAL_TABLET | ORAL | 0 refills | Status: DC

## 2021-10-09 MED ORDER — OSELTAMIVIR PHOSPHATE 75 MG PO CAPS: 75.0000 mg | ORAL_CAPSULE | Freq: Two times a day (BID) | ORAL | 0 refills | Status: AC

## 2021-10-09 NOTE — Discharge Instructions (Signed)
Your symptoms and physical exam findings are concerning for a viral respiratory infection.   You were tested for both COVID and influenza today because here in the urgent care setting, we do not have an available option for an individual influenza test.  I apologize for the redundancy if you have already taken a COVID test at home.  The result of your viral testing will be posted to your MyChart once it is complete, this typically takes 24 to 48 hours.  If there is a positive result, you will be contacted by phone with further recommendations, if any.    I recommend that you begin taking Tamiflu now for empiric treatment of presumed influenza based on the history provided to me today along with my physical exam findings. Tamiflu is an antiviral medication that decreases the severity, duration and transmissibility of influenza virus by preventing the virus from reproducing itself in your body.  As you may already know, we are unable to perform rapid testing for influenza due to a national shortage of rapid influenza tests.   If the influenza result is positive, please continue the full 5-day course of Tamiflu.  If the result is negative, please feel free to discontinue Tamiflu if you prefer but do keep in mind that Tamiflu can also be taken preventatively.  Finishing the full 5-day course will decrease the chances of catching influenza from anyone else and will not cause harm otherwise.    Conservative care is also recommended at this time.  This includes rest, pushing clear fluids and activity as tolerated.  Warm beverages his teas and broths versus cold beverages/popsicles and frozen sherbet/sorbet are personal choice, both warm and cold are beneficial.  You may also notice that your appetite is reduced; this is okay as long as you are drinking plenty of clear fluids.    Please see the list below for recommended medications, dosages and frequencies to provide relief of your current symptoms:       Ibuprofen  (Advil, Motrin): This is a good anti-inflammatory medication which addresses aches and pains and inflammation of the upper airways that causes sinus and nasal congestion as well as in the lower airways which makes your cough feel tight and sometimes burn.  I recommend that you take between 400 to 600 mg every 6-8 hours as needed.      Guaifenesin (Robitussin, Mucinex): This is an expectorant.  This helps break up chest congestion and loosen up thick nasal drainage making phlegm and drainage more liquid and therefore easier to remove.  I recommend being 400 mg three times daily as needed.      Ipratropium (Atrovent): This is an excellent nasal decongestant spray that does not cause rebound congestion, can be used up to 4 times daily as needed, instill 2 sprays into each nare with each use.  I have provided you with a prescription for this medication.       Please remain home from work, school, public places until you have been fever free for 24 hours without the use of antifever medications such as Tylenol or ibuprofen.  Please follow-up within the next 3 to 5 days either with your primary care provider or urgent care if your symptoms do not resolve.  If you do not have a primary care provider, we will assist you in finding one.

## 2021-10-09 NOTE — ED Triage Notes (Signed)
Pt c/o feeling facial pressure, congestion and fever since Monday.

## 2021-10-09 NOTE — ED Provider Notes (Signed)
[facility]    CSN: [id number] Arrival date & time: 10/09/21  8295    HISTORY  No chief complaint on file.  HPI Kathryn Reed is a 70 y.o. female. Pt c/o feeling facial pressure, congestion and fever since Monday, states she was at a restaurant Monday evening and early next to her at another table was coughing and wearing too much perfume.  Patient states when she got home from the restaurant she immediately began sneezing and started to have chills.  Patient states she checked her temperature at home yesterday evening and it was 100.4, today it is 99.4.  Patient states she is not having any ear pain but has noticed that she has lots of congestion in her nose and not much draining from it.  Patient states she is having facial pressure below both eyes.  The history is provided by the patient.  Past Medical History:  Diagnosis Date   Allergy    Hypertension    Pre-diabetes    diet controlled   Thyroid disease    Patient Active Problem List   Diagnosis Date Noted   Right knee pain 08/23/2017   Posterior vitreous detachment of both eyes 05/28/2017   Hypermetropia of both eyes 05/28/2017   Diabetes mellitus without complication (HCC) 05/28/2017   Cortical age-related cataract of both eyes 05/28/2017   Nuclear sclerotic cataract of both eyes 11/27/2016   Thyroid nodule 04/09/2016   Presbyopia 07/19/2014   Low grade squamous intraepithelial lesion (LGSIL) on cervical Pap smear 07/19/2014   Keratoconjunctivitis sicca, not specified as Sjogren's 07/19/2014   Dermatophytosis, nail 07/19/2014   Pain in finger of right hand 08/17/2013   Hypothyroid 01/26/2011   Hypertension 01/26/2011   Past Surgical History:  Procedure Laterality Date   ABDOMINAL HYSTERECTOMY  1987   1 OVARY REMOVED   COLONOSCOPY  07/20/2006   DB   MYOMECTOMY     30+ yrs ago    THYROID SURGERY  15 YRS AGO   LEFT SIDE   THYROIDECTOMY Right 04/09/2016   Procedure: COMPLETION THYROIDECTOMY;   Surgeon: Avel Peace, MD;  Location: [facility];  Service: General;  Laterality: Right;   OB History   No obstetric history on file.    Home Medications    Prior to Admission medications   Medication Sig Start Date End Date Taking? Authorizing Provider  acetaminophen (TYLENOL) 325 MG tablet Take 650 mg by mouth every 6 (six) hours as needed.    [provider]  amLODipine (NORVASC) 5 MG tablet Take 5 mg by mouth daily.   11/27/10   [provider]  atorvastatin (LIPITOR) 20 MG tablet  07/04/19   [provider]  cholecalciferol (VITAMIN D) 1000 units tablet Take 3,000 Units by mouth daily.    [provider]  conjugated estrogens (PREMARIN) vaginal cream Insert 1/4 applicator vaginally q HS 2wk prior to appt.  Stop using it 2d prior to appt. 07/05/17   [provider]  COVID-19 mRNA bivalent vaccine, Pfizer, (PFIZER COVID-19 VAC BIVALENT) injection Inject into the muscle. 08/07/21   Judyann Munson, MD  fluconazole (DIFLUCAN) 150 MG tablet Take 1 tablet on day 5 of antibiotics.  Take second tablet 3 days later. 08/21/21   Theadora Rama Scales, PA-C  fluticasone (FLONASE) 50 MCG/ACT nasal spray 2 sprays. PRN 12/08/14   [provider]  glimepiride (AMARYL) 2 MG tablet  12/31/17   [provider]  ipratropium (ATROVENT) 0.03 % nasal spray Place 2 sprays into both nostrils  every 12 (twelve) hours. 08/21/21   Theadora Rama Scales, PA-C  losartan-hydrochlorothiazide (HYZAAR) 100-25 MG per tablet Take 1 tablet by mouth daily.   11/27/10   [provider]  SYNTHROID 137 MCG tablet  01/27/18   [provider]  triamcinolone cream (KENALOG) 0.1 % Apply 1 application topically 2 (two) times daily. 06/16/21   Raspet, Noberto Retort, PA-C   Family History Family History  Problem Relation Age of Onset   Heart attack Mother    Hypertension Mother    Heart attack Father    Hypertension Father    Heart attack Sister    Diabetes  Sister    Hypertension Sister    Hyperlipidemia Brother    Sudden death Neg Hx    Colon cancer Neg Hx    Colon polyps Neg Hx    Esophageal cancer Neg Hx    Rectal cancer Neg Hx    Stomach cancer Neg Hx    Breast cancer Neg Hx    Social History Social History   Tobacco Use   Smoking status: Former    Packs/day: 0.50    Years: 10.00    Pack years: 5.00    Types: Cigarettes    Quit date: 10/13/1995    Years since quitting: 26.0   Smokeless tobacco: Never  Substance Use Topics   Alcohol use: No   Drug use: No   Allergies   Sulfa antibiotics, Metformin, and Rosuvastatin  Review of Systems Review of Systems Pertinent findings noted in history of present illness.   Physical Exam Triage Vital Signs ED Triage Vitals  Enc Vitals Group     BP 08/08/21 0827 (!) 147/82     Pulse Rate 08/08/21 0827 72     Resp 08/08/21 0827 18     Temp 08/08/21 0827 98.3 F (36.8 C)     Temp Source 08/08/21 0827 Oral     SpO2 08/08/21 0827 98 %     Weight --      Height --      Head Circumference --      Peak Flow --      Pain Score 08/08/21 0826 5     Pain Loc --      Pain Edu? --      Excl. in GC? --   No data found.  Updated Vital Signs BP 133/79 (BP Location: Right Arm)    Pulse 78    Temp 99.4 F (37.4 C) (Oral)    Resp 20    SpO2 96%   Physical Exam Vitals and nursing note reviewed.  Constitutional:      General: She is not in acute distress.    Appearance: Normal appearance. She is not ill-appearing.  HENT:     Head: Normocephalic and atraumatic.     Salivary Glands: Right salivary gland is not diffusely enlarged or tender. Left salivary gland is not diffusely enlarged or tender.     Right Ear: Ear canal and external ear normal. No drainage. A middle ear effusion is present. There is no impacted cerumen. Tympanic membrane is bulging. Tympanic membrane is not injected or erythematous.     Left Ear: Ear canal and external ear normal. No drainage. A middle ear effusion is  present. There is no impacted cerumen. Tympanic membrane is bulging. Tympanic membrane is not injected or erythematous.     Ears:     Comments: Bilateral EACs normal, both TMs bulging with clear fluid    Nose: Mucosal edema, congestion  and rhinorrhea present. No nasal deformity, septal deviation, signs of injury or nasal tenderness. Rhinorrhea is clear.     Right Nostril: Occlusion present. No foreign body, epistaxis or septal hematoma.     Left Nostril: Occlusion present. No foreign body, epistaxis or septal hematoma.     Right Turbinates: Enlarged, swollen and pale.     Left Turbinates: Enlarged, swollen and pale.     Right Sinus: Maxillary sinus tenderness present. No frontal sinus tenderness.     Left Sinus: Maxillary sinus tenderness present. No frontal sinus tenderness.     Mouth/Throat:     Lips: Pink. No lesions.     Mouth: Mucous membranes are moist. No oral lesions.     Pharynx: Oropharynx is clear. Uvula midline. No posterior oropharyngeal erythema or uvula swelling.     Tonsils: No tonsillar exudate. 0 on the right. 0 on the left.     Comments: Postnasal drip Eyes:     General: Lids are normal.        Right eye: No discharge.        Left eye: No discharge.     Extraocular Movements: Extraocular movements intact.     Conjunctiva/sclera: Conjunctivae normal.     Right eye: Right conjunctiva is not injected.     Left eye: Left conjunctiva is not injected.  Neck:     Trachea: Trachea and phonation normal.  Cardiovascular:     Rate and Rhythm: Normal rate and regular rhythm.     Pulses: Normal pulses.     Heart sounds: Normal heart sounds. No murmur heard.   No friction rub. No gallop.  Pulmonary:     Effort: Pulmonary effort is normal. No accessory muscle usage, prolonged expiration or respiratory distress.     Breath sounds: Normal breath sounds. No stridor, decreased air movement or transmitted upper airway sounds. No decreased breath sounds, wheezing, rhonchi or rales.   Chest:     Chest wall: No tenderness.  Musculoskeletal:        General: Normal range of motion.     Cervical back: Normal range of motion and neck supple. Normal range of motion.  Lymphadenopathy:     Cervical: No cervical adenopathy.  Skin:    General: Skin is warm and dry.     Findings: No erythema or rash.  Neurological:     General: No focal deficit present.     Mental Status: She is alert and oriented to person, place, and time.  Psychiatric:        Mood and Affect: Mood normal.        Behavior: Behavior normal.    Visual Acuity Right Eye Distance:   Left Eye Distance:   Bilateral Distance:    Right Eye Near:   Left Eye Near:    Bilateral Near:     UC Couse / Diagnostics / Procedures:    EKG  Radiology No results found.  Procedures Procedures (including critical care time)  UC Diagnoses / Final Clinical Impressions(s)   I have reviewed the triage vital signs and the nursing notes.  Pertinent labs & imaging results that were available during my care of the patient were reviewed by me and considered in my medical decision making (see chart for details).   Final diagnoses:  Fever, unspecified fever cause  Acute rhinosinusitis  Viral upper respiratory illness   Patient has significant inflammation of both turbinates with clear sinus drainage.  Most likely viral sinusitis, patient provided with Atrovent and ibuprofen.  COVID and flu testing performed, will notify patient with results once received, patient will be started empirically on Tamiflu given possible influenza exposure and restaurant. ED Prescriptions     Medication Sig Dispense Auth. Provider   oseltamivir (TAMIFLU) 75 MG capsule Take 1 capsule (75 mg total) by mouth every 12 (twelve) hours for 5 days. 10 capsule Theadora Rama Scales, PA-C   ipratropium (ATROVENT) 0.06 % nasal spray Place 2 sprays into both nostrils 4 (four) times daily. As needed for nasal congestion, runny nose 15 mL Theadora Rama  Scales, PA-C   ibuprofen (ADVIL) 400 MG tablet Take 1 tablet (400 mg total) by mouth every 8 (eight) hours as needed for up to 30 doses. 30 tablet Theadora Rama Scales, PA-C   guaifenesin (HUMIBID E) 400 MG TABS tablet Take 1 tablet 3 times daily as needed for chest congestion and cough 21 tablet Theadora Rama Scales, PA-C      PDMP not reviewed this encounter.  Pending results:  Labs Reviewed  COVID-19, FLU A+B NAA    Medications Ordered in UC: Medications - No data to display  Disposition Upon Discharge:  Condition: stable for discharge home Home: take medications as prescribed; routine discharge instructions as discussed; follow up as advised.  Patient presented with an acute illness with associated systemic symptoms and significant discomfort requiring urgent management. In my opinion, this is a condition that a prudent lay person (someone who possesses an average knowledge of health and medicine) may potentially expect to result in complications if not addressed urgently such as respiratory distress, impairment of bodily function or dysfunction of bodily organs.   Routine symptom specific, illness specific and/or disease specific instructions were discussed with the patient and/or caregiver at length.   As such, the patient has been evaluated and assessed, work-up was performed and treatment was provided in alignment with urgent care protocols and evidence based medicine.  Patient/parent/caregiver has been advised that the patient may require follow up for further testing and treatment if the symptoms continue in spite of treatment, as clinically indicated and appropriate.  If the patient was tested for COVID-19, Influenza and/or RSV, then the patient/parent/guardian was advised to isolate at home pending the results of his/her diagnostic coronavirus test and potentially longer if theyre positive. I have also advised pt that if his/her COVID-19 test returns positive, it's  recommended to self-isolate for at least 10 days after symptoms first appeared AND until fever-free for 24 hours without fever reducer AND other symptoms have improved or resolved. Discussed self-isolation recommendations as well as instructions for household member/close contacts as per the [facility] and [place] DHHS, and also gave patient the COVID packet with this information.  Patient/parent/caregiver has been advised to return to the [facility] or PCP in 3-5 days if no better; to PCP or the Emergency Department if new signs and symptoms develop, or if the current signs or symptoms continue to change or worsen for further workup, evaluation and treatment as clinically indicated and appropriate  The patient will follow up with their current PCP if and as advised. If the patient does not currently have a PCP we will assist them in obtaining one.   The patient may need specialty follow up if the symptoms continue, in spite of conservative treatment and management, for further workup, evaluation, consultation and treatment as clinically indicated and appropriate.  Patient/parent/caregiver verbalized understanding and agreement of plan as discussed.  All questions were addressed during visit.  Please see discharge instructions below for further details  of plan.  Discharge Instructions:   Discharge Instructions      Your symptoms and physical exam findings are concerning for a viral respiratory infection.   You were tested for both COVID and influenza today because here in the urgent care setting, we do not have an available option for an individual influenza test.  I apologize for the redundancy if you have already taken a COVID test at home.  The result of your viral testing will be posted to your MyChart once it is complete, this typically takes 24 to 48 hours.  If there is a positive result, you will be contacted by phone with further recommendations, if any.    I recommend that you begin taking Tamiflu now for  empiric treatment of presumed influenza based on the history provided to me today along with my physical exam findings. Tamiflu is an antiviral medication that decreases the severity, duration and transmissibility of influenza virus by preventing the virus from reproducing itself in your body.  As you may already know, we are unable to perform rapid testing for influenza due to a national shortage of rapid influenza tests.   If the influenza result is positive, please continue the full 5-day course of Tamiflu.  If the result is negative, please feel free to discontinue Tamiflu if you prefer but do keep in mind that Tamiflu can also be taken preventatively.  Finishing the full 5-day course will decrease the chances of catching influenza from anyone else and will not cause harm otherwise.    Conservative care is also recommended at this time.  This includes rest, pushing clear fluids and activity as tolerated.  Warm beverages his teas and broths versus cold beverages/popsicles and frozen sherbet/sorbet are personal choice, both warm and cold are beneficial.  You may also notice that your appetite is reduced; this is okay as long as you are drinking plenty of clear fluids.    Please see the list below for recommended medications, dosages and frequencies to provide relief of your current symptoms:      Ibuprofen  (Advil, Motrin): This is a good anti-inflammatory medication which addresses aches and pains and inflammation of the upper airways that causes sinus and nasal congestion as well as in the lower airways which makes your cough feel tight and sometimes burn.  I recommend that you take between 400 to 600 mg every 6-8 hours as needed.      Guaifenesin (Robitussin, Mucinex): This is an expectorant.  This helps break up chest congestion and loosen up thick nasal drainage making phlegm and drainage more liquid and therefore easier to remove.  I recommend being 400 mg three times daily as needed.       Ipratropium (Atrovent): This is an excellent nasal decongestant spray that does not cause rebound congestion, can be used up to 4 times daily as needed, instill 2 sprays into each nare with each use.  I have provided you with a prescription for this medication.       Please remain home from work, school, public places until you have been fever free for 24 hours without the use of antifever medications such as Tylenol or ibuprofen.  Please follow-up within the next 3 to 5 days either with your primary care provider or urgent care if your symptoms do not resolve.  If you do not have a primary care provider, we will assist you in finding one.       This office note has been dictated using  Dragon Engineer, civil (consulting).  Unfortunately, and despite my best efforts, this method of dictation can sometimes lead to occasional typographical or grammatical errors.  I apologize in advance if this occurs.       Theadora Rama Scales, [place] 10/09/21 [phone]

## 2021-10-11 LAB — COVID-19, FLU A+B NAA
Influenza A, NAA: NOT DETECTED
Influenza B, NAA: NOT DETECTED
SARS-CoV-2, NAA: NOT DETECTED

## 2021-11-11 DIAGNOSIS — E7800 Pure hypercholesterolemia, unspecified: Secondary | ICD-10-CM | POA: Diagnosis not present

## 2021-11-11 DIAGNOSIS — I10 Essential (primary) hypertension: Secondary | ICD-10-CM | POA: Diagnosis not present

## 2021-11-11 DIAGNOSIS — E119 Type 2 diabetes mellitus without complications: Secondary | ICD-10-CM | POA: Diagnosis not present

## 2021-11-11 DIAGNOSIS — E1169 Type 2 diabetes mellitus with other specified complication: Secondary | ICD-10-CM | POA: Diagnosis not present

## 2021-11-11 DIAGNOSIS — E039 Hypothyroidism, unspecified: Secondary | ICD-10-CM | POA: Diagnosis not present

## 2021-12-25 IMAGING — US US EXTREM LOW VENOUS*L*
1 series · 13 of 24 positions shown · non-contrast
Comparison: None.

CLINICAL DATA: 68-year-old female with posterior knee pain



[Series 1: us extrem low venous*left* · 13 of 31 slices shown]
[im 1/31]
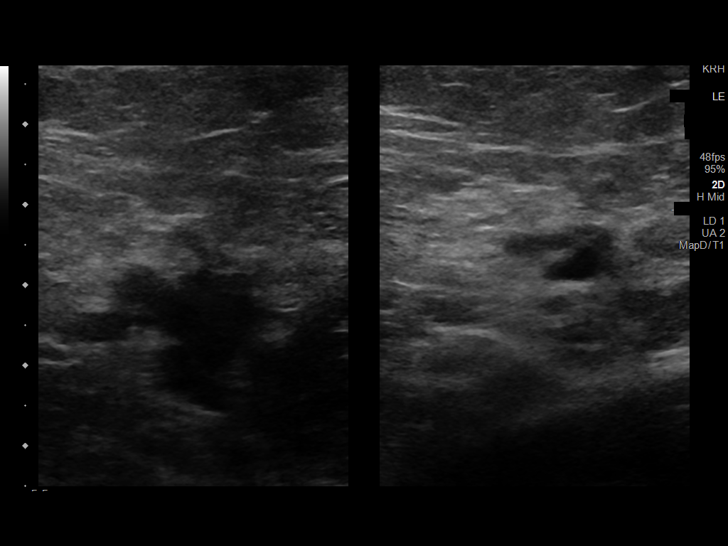
[im 3/31]
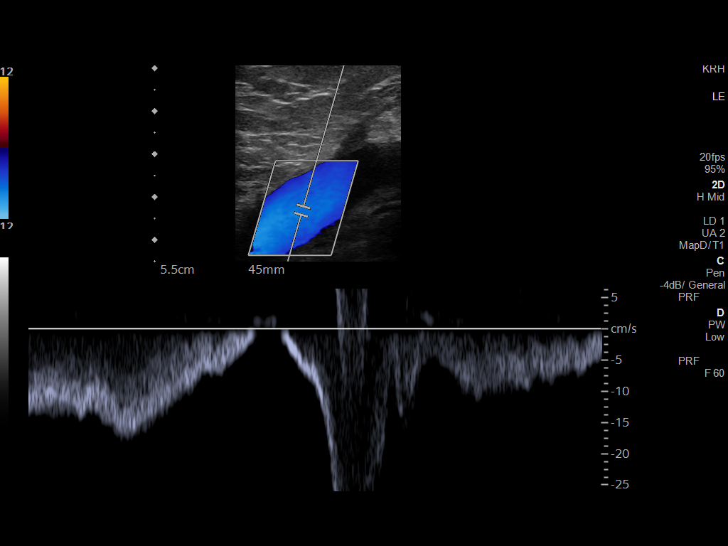
[im 6/31]
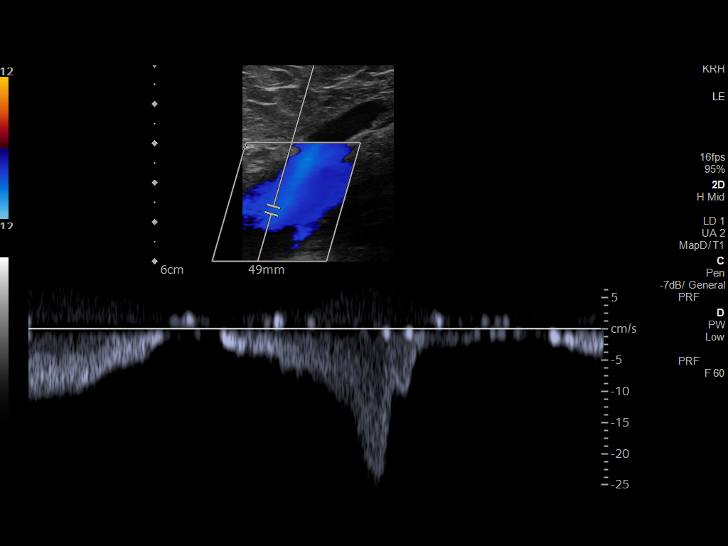
[im 8/31]
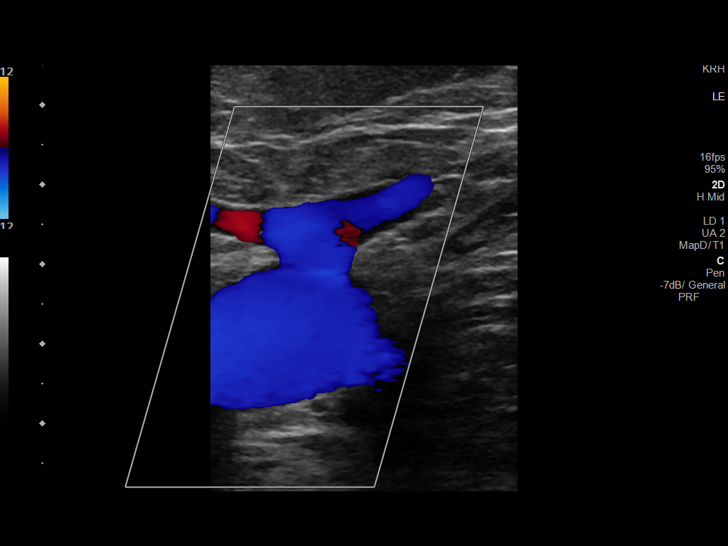
[im 11/31]
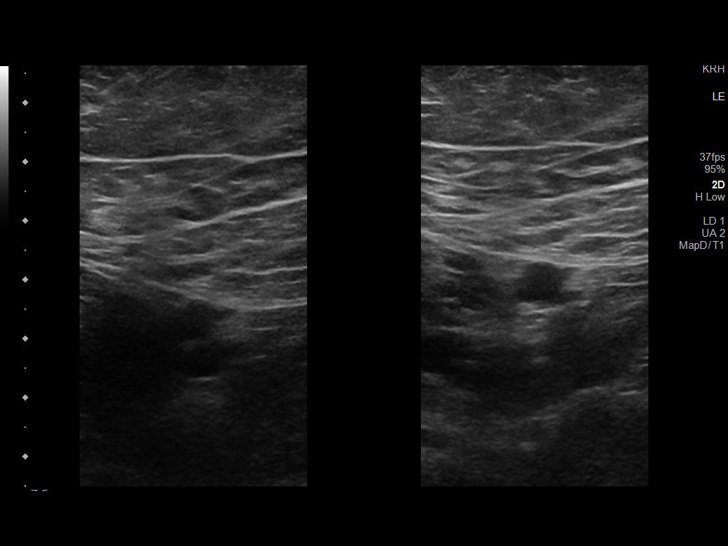
[im 14/31]
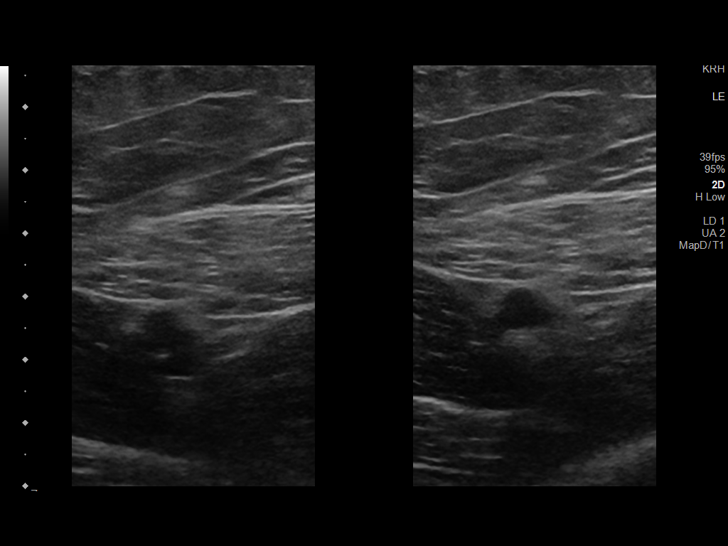
[im 16/31]
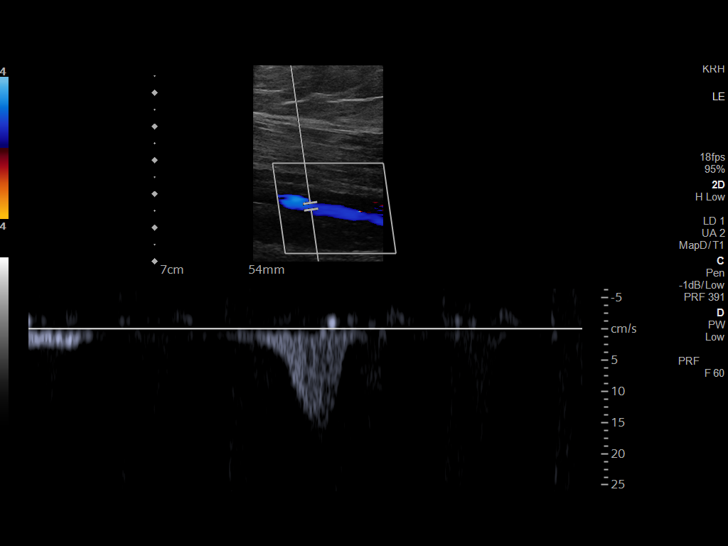
[im 17/31]
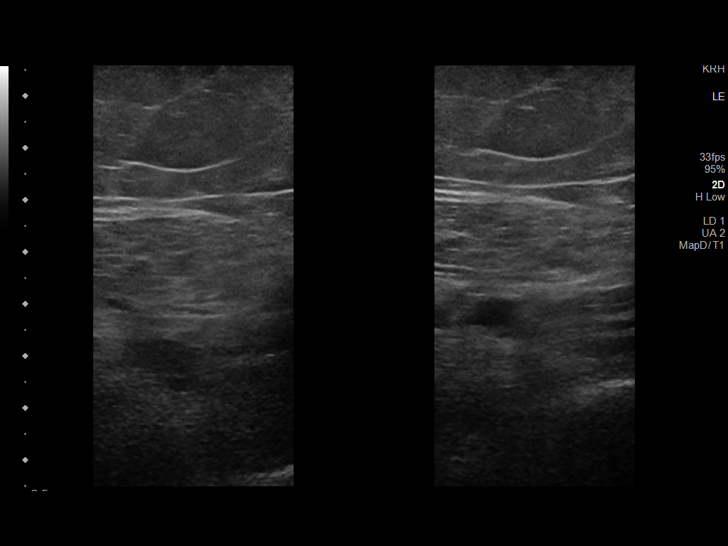
[im 20/31]
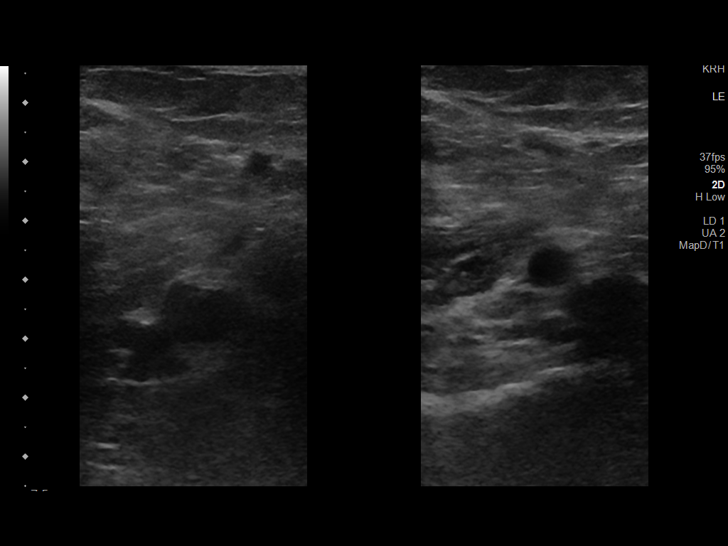
[im 23/31]
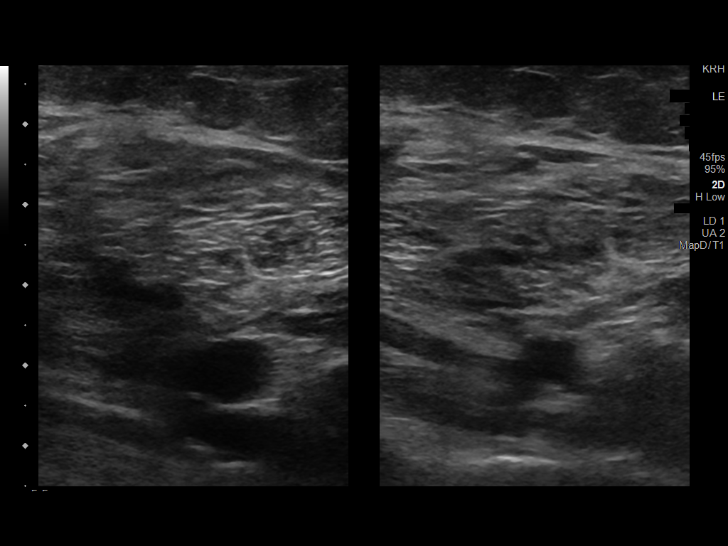
[im 25/31]
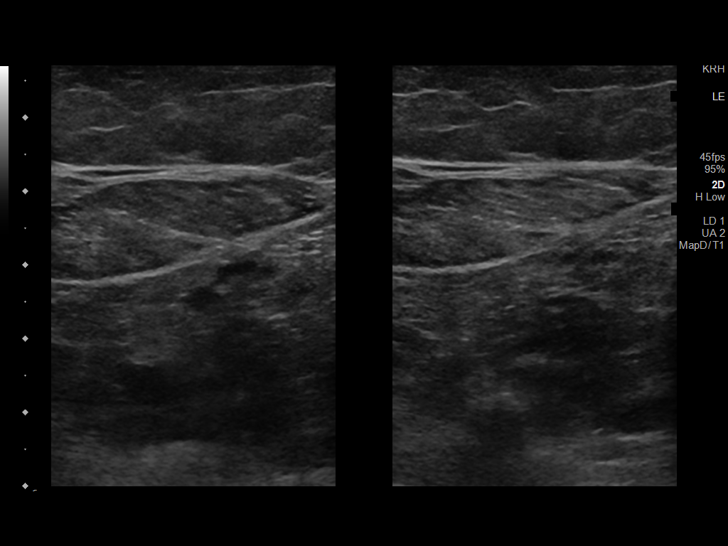
[im 28/31]
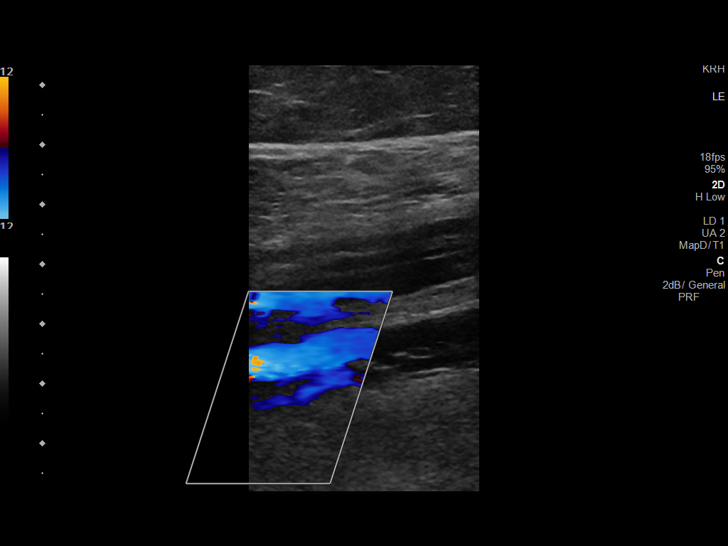
[im 31/31]
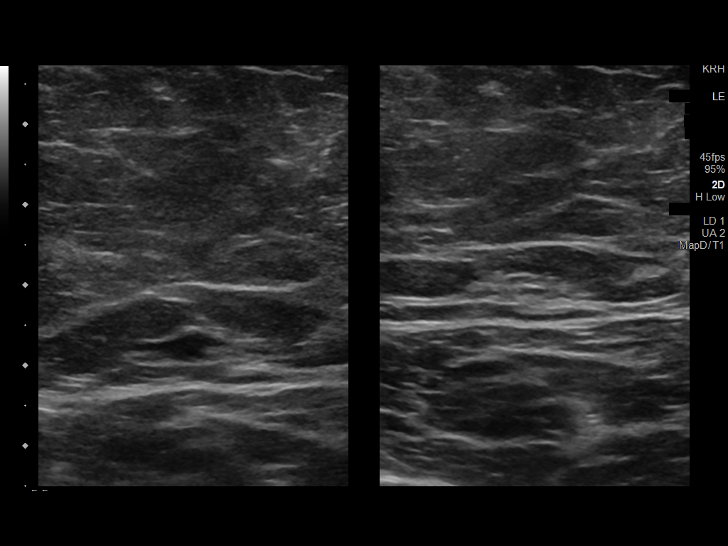

[13 of 24 positions shown; findings below may reference images not displayed]

FINDINGS: Contralateral Common Femoral Vein: Respiratory phasicity is normal
and symmetric with the symptomatic side. No evidence of thrombus.
Normal compressibility.

Common Femoral Vein: No evidence of thrombus. Normal
compressibility, respiratory phasicity and response to augmentation.

Saphenofemoral Junction: No evidence of thrombus. Normal
compressibility and flow on color Doppler imaging.

Profunda Femoral Vein: No evidence of thrombus. Normal
compressibility and flow on color Doppler imaging.

Femoral Vein: No evidence of thrombus. Normal compressibility,
respiratory phasicity and response to augmentation.

Popliteal Vein: No evidence of thrombus. Normal compressibility,
respiratory phasicity and response to augmentation.

Calf Veins: Calf veins not well evaluated

Superficial Great Saphenous Vein: No evidence of thrombus. Normal
compressibility and flow on color Doppler imaging.

Other Findings:  None.
IMPRESSION: Sonographic survey of the left lower extremity negative for DVT

## 2022-03-10 DIAGNOSIS — Z5181 Encounter for therapeutic drug level monitoring: Secondary | ICD-10-CM | POA: Diagnosis not present

## 2022-03-10 DIAGNOSIS — E1169 Type 2 diabetes mellitus with other specified complication: Secondary | ICD-10-CM | POA: Diagnosis not present

## 2022-03-10 DIAGNOSIS — I10 Essential (primary) hypertension: Secondary | ICD-10-CM | POA: Diagnosis not present

## 2022-03-10 DIAGNOSIS — Z0000 Encounter for general adult medical examination without abnormal findings: Secondary | ICD-10-CM | POA: Diagnosis not present

## 2022-03-10 DIAGNOSIS — E7800 Pure hypercholesterolemia, unspecified: Secondary | ICD-10-CM | POA: Diagnosis not present

## 2022-03-10 DIAGNOSIS — Z23 Encounter for immunization: Secondary | ICD-10-CM | POA: Diagnosis not present

## 2022-04-09 DIAGNOSIS — H5203 Hypermetropia, bilateral: Secondary | ICD-10-CM | POA: Diagnosis not present

## 2022-04-09 DIAGNOSIS — H16223 Keratoconjunctivitis sicca, not specified as Sjogren's, bilateral: Secondary | ICD-10-CM | POA: Diagnosis not present

## 2022-04-09 DIAGNOSIS — H2513 Age-related nuclear cataract, bilateral: Secondary | ICD-10-CM | POA: Diagnosis not present

## 2022-04-09 DIAGNOSIS — H524 Presbyopia: Secondary | ICD-10-CM | POA: Diagnosis not present

## 2022-04-09 DIAGNOSIS — H52203 Unspecified astigmatism, bilateral: Secondary | ICD-10-CM | POA: Diagnosis not present

## 2022-04-09 DIAGNOSIS — H25013 Cortical age-related cataract, bilateral: Secondary | ICD-10-CM | POA: Diagnosis not present

## 2022-05-19 ENCOUNTER — Ambulatory Visit
Admission: EM | Admit: 2022-05-19 | Discharge: 2022-05-19 | Disposition: A | Discharge status: Home / Self Care | Payer: Medicare Other | Attending: Family Medicine | Admitting: Family Medicine

## 2022-05-19 DIAGNOSIS — N3090 Cystitis, unspecified without hematuria: Secondary | ICD-10-CM | POA: Insufficient documentation

## 2022-05-19 LAB — POCT URINALYSIS DIP (MANUAL ENTRY)
Bilirubin, UA: NEGATIVE
Glucose, UA: >=1000 mg/dL — AB
Ketones, POC UA: NEGATIVE mg/dL
Leukocytes, UA: NEGATIVE
Nitrite, UA: NEGATIVE
Protein Ur, POC: NEGATIVE mg/dL
Spec Grav, UA: <=1.005 — AB (ref 1.010–1.025)
Urobilinogen, UA: 0.2 E.U./dL
pH, UA: 6 (ref 5.0–8.0)

## 2022-05-19 MED ORDER — CEPHALEXIN 250 MG PO CAPS: 250.0000 mg | ORAL_CAPSULE | Freq: Three times a day (TID) | ORAL | 0 refills | Status: AC

## 2022-05-19 MED ORDER — FLUCONAZOLE 150 MG PO TABS: 150.0000 mg | ORAL_TABLET | ORAL | 0 refills | Status: AC

## 2022-05-19 NOTE — ED Triage Notes (Signed)
The patient c/o lower back pain, urinary frequency and fatigue. The patient states she has been taking Jardiance for 3 weeks.  Home interventions: tylenol

## 2022-05-19 NOTE — ED Provider Notes (Signed)
[facility]    CSN: [id number] Arrival date & time: 05/19/22  1510      History   Chief Complaint Chief Complaint  Patient presents with   Urinary Frequency   Back Pain    HPI Kathryn Reed is a 71 y.o. female.    Urinary Frequency  Back Pain  Here for low back pain and urinary frequency that is been bothering her in the last 3 or 4 days.  No fever or chills.  She maybe had a little dysuria this morning.  No blood in the urine.  She has been placed on Jardiance recently for her diabetes  She states she had a little rhinorrhea yesterday but none today.  She has no cough or congestion today  Past Medical History:  Diagnosis Date   Allergy    Hypertension    Pre-diabetes    diet controlled   Thyroid disease     Patient Active Problem List   Diagnosis Date Noted   Right knee pain 08/23/2017   Posterior vitreous detachment of both eyes 05/28/2017   Hypermetropia of both eyes 05/28/2017   Diabetes mellitus without complication (HCC) 05/28/2017   Cortical age-related cataract of both eyes 05/28/2017   Nuclear sclerotic cataract of both eyes 11/27/2016   Thyroid nodule 04/09/2016   Presbyopia 07/19/2014   Low grade squamous intraepithelial lesion (LGSIL) on cervical Pap smear 07/19/2014   Keratoconjunctivitis sicca, not specified as Sjogren's 07/19/2014   Dermatophytosis, nail 07/19/2014   Pain in finger of right hand 08/17/2013   Hypothyroid 01/26/2011   Hypertension 01/26/2011    Past Surgical History:  Procedure Laterality Date   ABDOMINAL HYSTERECTOMY  1987   1 OVARY REMOVED   COLONOSCOPY  07/20/2006   DB   MYOMECTOMY     30+ yrs ago    THYROID SURGERY  15 YRS AGO   LEFT SIDE   THYROIDECTOMY Right 04/09/2016   Procedure: COMPLETION THYROIDECTOMY;  Surgeon: Avel Peace, MD;  Location: [facility];  Service: General;  Laterality: Right;    OB History   No obstetric history on file.      Home Medications    Prior to Admission  medications   Medication Sig Start Date End Date Taking? Authorizing Provider  cephALEXin (KEFLEX) 250 MG capsule Take 1 capsule (250 mg total) by mouth 3 (three) times daily for 7 days. 05/19/22 05/26/22 Yes Athziry Millican, Janace Aris, MD  acetaminophen (TYLENOL) 325 MG tablet Take 650 mg by mouth every 6 (six) hours as needed.    [provider]  amLODipine (NORVASC) 5 MG tablet Take 5 mg by mouth daily.   11/27/10   [provider]  atorvastatin (LIPITOR) 20 MG tablet  07/04/19   [provider]  cholecalciferol (VITAMIN D) 1000 units tablet Take 3,000 Units by mouth daily.    [provider]  conjugated estrogens (PREMARIN) vaginal cream Insert 1/4 applicator vaginally q HS 2wk prior to appt.  Stop using it 2d prior to appt. 07/05/17   [provider]  COVID-19 mRNA bivalent vaccine, Pfizer, (PFIZER COVID-19 VAC BIVALENT) injection Inject into the muscle. 08/07/21   Judyann Munson, MD  fluconazole (DIFLUCAN) 150 MG tablet Take 1 tablet (150 mg total) by mouth every 3 (three) days for 2 doses. If you develop symptoms of a yeast infection 05/19/22 05/23/22  Zenia Resides, MD  fluticasone (FLONASE) 50 MCG/ACT nasal spray 2 sprays. PRN 12/08/14   [provider]  glimepiride (AMARYL) 2 MG tablet  12/31/17   [provider]  ibuprofen (ADVIL) 400 MG tablet Take 1 tablet (400 mg total) by mouth every 8 (eight) hours as needed for up to 30 doses. 10/09/21   Lynden Oxford Scales, PA-C  ipratropium (ATROVENT) 0.06 % nasal spray Place 2 sprays into both nostrils 4 (four) times daily. As needed for nasal congestion, runny nose 10/09/21   Lynden Oxford Scales, PA-C  JARDIANCE 10 MG TABS tablet Take 10 mg by mouth daily. 04/13/22   [provider]  losartan-hydrochlorothiazide (HYZAAR) 100-25 MG per tablet Take 1 tablet by mouth daily.   11/27/10   [provider]  SYNTHROID 137 MCG tablet  01/27/18   [provider]  triamcinolone  cream (KENALOG) 0.1 % Apply 1 application topically 2 (two) times daily. 06/16/21   Raspet, Derry Skill, PA-C    Family History Family History  Problem Relation Age of Onset   Heart attack Mother    Hypertension Mother    Heart attack Father    Hypertension Father    Heart attack Sister    Diabetes Sister    Hypertension Sister    Hyperlipidemia Brother    Sudden death Neg Hx    Colon cancer Neg Hx    Colon polyps Neg Hx    Esophageal cancer Neg Hx    Rectal cancer Neg Hx    Stomach cancer Neg Hx    Breast cancer Neg Hx     Social History Social History   Tobacco Use   Smoking status: Former    Packs/day: 0.50    Years: 10.00    Total pack years: 5.00    Types: Cigarettes    Quit date: 10/13/1995    Years since quitting: 26.6   Smokeless tobacco: Never  Substance Use Topics   Alcohol use: No   Drug use: No     Allergies   Sulfa antibiotics, Metformin, and Rosuvastatin   Review of Systems Review of Systems  Genitourinary:  Positive for frequency.  Musculoskeletal:  Positive for back pain.     Physical Exam Triage Vital Signs ED Triage Vitals  Enc Vitals Group     BP 05/19/22 1544 123/80     Pulse Rate 05/19/22 1544 71     Resp 05/19/22 1544 18     Temp 05/19/22 1544 98.4 F (36.9 C)     Temp Source 05/19/22 1544 Oral     SpO2 05/19/22 1544 97 %     Weight --      Height --      Head Circumference --      Peak Flow --      Pain Score 05/19/22 1552 5     Pain Loc --      Pain Edu? --      Excl. in [place]? --    No data found.  Updated Vital Signs BP 123/80 (BP Location: Right Arm)   Pulse 71   Temp 98.4 F (36.9 C) (Oral)   Resp 18   SpO2 97%   Visual Acuity Right Eye Distance:   Left Eye Distance:   Bilateral Distance:    Right Eye Near:   Left Eye Near:    Bilateral Near:     Physical Exam Vitals reviewed.  Constitutional:      General: She is not in acute distress.    Appearance: She is not toxic-appearing.  Cardiovascular:     Rate  and Rhythm: Normal rate and regular rhythm.  Heart sounds: No murmur heard. Pulmonary:     Effort: Pulmonary effort is normal.     Breath sounds: Normal breath sounds.  Abdominal:     Palpations: Abdomen is soft.     Tenderness: There is no abdominal tenderness.  Musculoskeletal:     Right lower leg: No edema.     Left lower leg: No edema.  Skin:    Coloration: Skin is not jaundiced or pale.  Neurological:     Mental Status: She is alert and oriented to person, place, and time.      UC Treatments / Results  Labs (all labs ordered are listed, but only abnormal results are displayed) Labs Reviewed  POCT URINALYSIS DIP (MANUAL ENTRY) - Abnormal; Notable for the following components:      Result Value   Color, UA light yellow (*)    Glucose, UA >=1,000 (*)    Spec Grav, UA <=1.005 (*)    Blood, UA trace-intact (*)    All other components within normal limits  URINE CULTURE    EKG   Radiology No results found.  Procedures Procedures (including critical care time)  Medications Ordered in UC Medications - No data to display  Initial Impression / Assessment and Plan / UC Course  I have reviewed the triage vital signs and the nursing notes.  Pertinent labs & imaging results that were available during my care of the patient were reviewed by me and considered in my medical decision making (see chart for details).     There is a trace of blood on the urinalysis.  There is more than 1000 mg percent of glucose, but she is taking Jardiance.  I am going to treat for possible cystitis and culture the urine Final Clinical Impressions(s) / UC Diagnoses   Final diagnoses:  Cystitis     Discharge Instructions      The urinalysis had a little bit of blood on it and some sugar.  Urine culture is sent to see if any bacteria grow.  Take cephalexin 250 mg--1 capsule 3 times daily for 7 days  Fluconazole 150 mg is also sent in--if you have any sign of yeast infection, then  take 1 tablet every 3 days for 2 doses  Please follow-up with your primary care doctor     ED Prescriptions     Medication Sig Dispense Auth. Provider   fluconazole (DIFLUCAN) 150 MG tablet Take 1 tablet (150 mg total) by mouth every 3 (three) days for 2 doses. If you develop symptoms of a yeast infection 2 tablet Triniti Gruetzmacher, Janace Aris, MD   cephALEXin (KEFLEX) 250 MG capsule Take 1 capsule (250 mg total) by mouth 3 (three) times daily for 7 days. 21 capsule Zenia Resides, MD      PDMP not reviewed this encounter.   Zenia Resides, MD 05/19/22 [phone]

## 2022-05-19 NOTE — Discharge Instructions (Addendum)
The urinalysis had a little bit of blood on it and some sugar.  Urine culture is sent to see if any bacteria grow.  Take cephalexin 250 mg--1 capsule 3 times daily for 7 days  Fluconazole 150 mg is also sent in--if you have any sign of yeast infection, then take 1 tablet every 3 days for 2 doses  Please follow-up with your primary care doctor

## 2022-05-21 LAB — URINE CULTURE: Culture: 10000 — AB

## 2022-07-06 DIAGNOSIS — Z1231 Encounter for screening mammogram for malignant neoplasm of breast: Secondary | ICD-10-CM | POA: Diagnosis not present

## 2022-07-06 DIAGNOSIS — Z23 Encounter for immunization: Secondary | ICD-10-CM | POA: Diagnosis not present

## 2022-07-15 DIAGNOSIS — Z23 Encounter for immunization: Secondary | ICD-10-CM | POA: Diagnosis not present

## 2022-09-23 DIAGNOSIS — Z882 Allergy status to sulfonamides status: Secondary | ICD-10-CM | POA: Diagnosis not present

## 2022-09-23 DIAGNOSIS — R350 Frequency of micturition: Secondary | ICD-10-CM | POA: Diagnosis not present

## 2022-09-23 DIAGNOSIS — Z888 Allergy status to other drugs, medicaments and biological substances status: Secondary | ICD-10-CM | POA: Diagnosis not present

## 2022-09-23 DIAGNOSIS — N939 Abnormal uterine and vaginal bleeding, unspecified: Secondary | ICD-10-CM | POA: Diagnosis not present

## 2022-10-13 DIAGNOSIS — H52203 Unspecified astigmatism, bilateral: Secondary | ICD-10-CM | POA: Diagnosis not present

## 2022-10-13 DIAGNOSIS — H16223 Keratoconjunctivitis sicca, not specified as Sjogren's, bilateral: Secondary | ICD-10-CM | POA: Diagnosis not present

## 2022-10-13 DIAGNOSIS — H25013 Cortical age-related cataract, bilateral: Secondary | ICD-10-CM | POA: Diagnosis not present

## 2022-10-13 DIAGNOSIS — E119 Type 2 diabetes mellitus without complications: Secondary | ICD-10-CM | POA: Diagnosis not present

## 2022-10-13 DIAGNOSIS — Z7984 Long term (current) use of oral hypoglycemic drugs: Secondary | ICD-10-CM | POA: Diagnosis not present

## 2022-10-13 DIAGNOSIS — H524 Presbyopia: Secondary | ICD-10-CM | POA: Diagnosis not present

## 2022-10-13 DIAGNOSIS — H2513 Age-related nuclear cataract, bilateral: Secondary | ICD-10-CM | POA: Diagnosis not present

## 2022-10-13 DIAGNOSIS — H5203 Hypermetropia, bilateral: Secondary | ICD-10-CM | POA: Diagnosis not present

## 2022-10-13 DIAGNOSIS — H43813 Vitreous degeneration, bilateral: Secondary | ICD-10-CM | POA: Diagnosis not present

## 2022-10-14 DIAGNOSIS — Z01419 Encounter for gynecological examination (general) (routine) without abnormal findings: Secondary | ICD-10-CM | POA: Diagnosis not present

## 2022-10-14 DIAGNOSIS — Z1272 Encounter for screening for malignant neoplasm of vagina: Secondary | ICD-10-CM | POA: Diagnosis not present

## 2022-10-14 DIAGNOSIS — Z888 Allergy status to other drugs, medicaments and biological substances status: Secondary | ICD-10-CM | POA: Diagnosis not present

## 2022-10-14 DIAGNOSIS — Z882 Allergy status to sulfonamides status: Secondary | ICD-10-CM | POA: Diagnosis not present

## 2022-10-19 DIAGNOSIS — E1169 Type 2 diabetes mellitus with other specified complication: Secondary | ICD-10-CM | POA: Diagnosis not present

## 2022-10-19 DIAGNOSIS — E7800 Pure hypercholesterolemia, unspecified: Secondary | ICD-10-CM | POA: Diagnosis not present

## 2022-10-19 DIAGNOSIS — E039 Hypothyroidism, unspecified: Secondary | ICD-10-CM | POA: Diagnosis not present

## 2022-10-19 DIAGNOSIS — I10 Essential (primary) hypertension: Secondary | ICD-10-CM | POA: Diagnosis not present

## 2023-02-03 DIAGNOSIS — E1169 Type 2 diabetes mellitus with other specified complication: Secondary | ICD-10-CM | POA: Diagnosis not present

## 2023-04-06 ENCOUNTER — Encounter: Payer: Self-pay | Admitting: Family Medicine

## 2023-04-06 ENCOUNTER — Ambulatory Visit (INDEPENDENT_AMBULATORY_CARE_PROVIDER_SITE_OTHER): Payer: Medicare Other | Admitting: Family Medicine

## 2023-04-06 VITALS — BP 130/80 | Ht 68.0 in | Wt 260.0 lb

## 2023-04-06 DIAGNOSIS — M79644 Pain in right finger(s): Secondary | ICD-10-CM

## 2023-04-06 NOTE — Patient Instructions (Signed)
You have a trigger finger.   Do the band-aid splinting as shown regularly. Topical voltaren gel 3-4 times a day to that area in the palm of your hand. Follow up with me in 6 weeks. If pain is worsening however we can do an injection at any time - just call us.

## 2023-04-06 NOTE — Progress Notes (Signed)
PCP: Renford Dills, MD  Subjective:   HPI: Patient is a 72 y.o. female here for right finger pain.  Patient reports over past few weeks she's had worsening palmar right ring finger pain. This feels similar to prior issues where she's had trigger fingers. Locked once on her in flexion. No numbness, other complaints. No injuries.  Past Medical History:  Diagnosis Date   Allergy    Hypertension    Pre-diabetes    diet controlled   Thyroid disease     Current Outpatient Medications on File Prior to Visit  Medication Sig Dispense Refill   acetaminophen (TYLENOL) 325 MG tablet Take 650 mg by mouth every 6 (six) hours as needed.     amLODipine (NORVASC) 5 MG tablet Take 5 mg by mouth daily.       atorvastatin (LIPITOR) 20 MG tablet      cholecalciferol (VITAMIN D) 1000 units tablet Take 3,000 Units by mouth daily.     conjugated estrogens (PREMARIN) vaginal cream Insert 1/4 applicator vaginally q HS 2wk prior to appt.  Stop using it 2d prior to appt.     COVID-19 mRNA bivalent vaccine, Pfizer, (PFIZER COVID-19 VAC BIVALENT) injection Inject into the muscle. 0.3 mL 0   fluticasone (FLONASE) 50 MCG/ACT nasal spray 2 sprays. PRN     glimepiride (AMARYL) 2 MG tablet      ibuprofen (ADVIL) 400 MG tablet Take 1 tablet (400 mg total) by mouth every 8 (eight) hours as needed for up to 30 doses. 30 tablet 0   ipratropium (ATROVENT) 0.06 % nasal spray Place 2 sprays into both nostrils 4 (four) times daily. As needed for nasal congestion, runny nose 15 mL 0   JARDIANCE 10 MG TABS tablet Take 10 mg by mouth daily.     losartan-hydrochlorothiazide (HYZAAR) 100-25 MG per tablet Take 1 tablet by mouth daily.       SYNTHROID 137 MCG tablet      triamcinolone cream (KENALOG) 0.1 % Apply 1 application topically 2 (two) times daily. 30 g 0   Current Facility-Administered Medications on File Prior to Visit  Medication Dose Route Frequency Provider Last Rate Last Admin   0.9 %  sodium chloride infusion   500 mL Intravenous Continuous Nandigam, Eleonore Chiquito, MD        Past Surgical History:  Procedure Laterality Date   ABDOMINAL HYSTERECTOMY  1987   1 OVARY REMOVED   COLONOSCOPY  07/20/2006   DB   MYOMECTOMY     30+ yrs ago    THYROID SURGERY  15 YRS AGO   LEFT SIDE   THYROIDECTOMY Right 04/09/2016   Procedure: COMPLETION THYROIDECTOMY;  Surgeon: Avel Peace, MD;  Location: [facility];  Service: General;  Laterality: Right;    Allergies  Allergen Reactions   Sulfa Antibiotics     RASH   Metformin Other (See Comments)   Rosuvastatin Other (See Comments)    myalgia    BP 130/80 (BP Location: Left Arm, Patient Position: Sitting)   Ht 5\' 8"  (1.727 m)   Wt 260 lb (117.9 kg)   BMI 39.53 kg/m       No data to display              No data to display              Objective:  Physical Exam:  Gen: NAD, comfortable in exam room  Right 4th digit: No deformity, swelling, bruising. FROM with 5/5 strength flexion and extension at  PIP, DIP, MCP joints. Tenderness to palpation over A1 pulley. NVI distally.   Assessment & Plan:  1. Right trigger finger - discussed options - she will start with topical voltaren gel, band-aid splinting of PIP in extension.  F/u in 6 weeks.  Consider injection if not improving.

## 2023-04-13 DIAGNOSIS — H25013 Cortical age-related cataract, bilateral: Secondary | ICD-10-CM | POA: Diagnosis not present

## 2023-04-13 DIAGNOSIS — H04123 Dry eye syndrome of bilateral lacrimal glands: Secondary | ICD-10-CM | POA: Diagnosis not present

## 2023-04-13 DIAGNOSIS — H2513 Age-related nuclear cataract, bilateral: Secondary | ICD-10-CM | POA: Diagnosis not present

## 2023-04-13 DIAGNOSIS — H52223 Regular astigmatism, bilateral: Secondary | ICD-10-CM | POA: Diagnosis not present

## 2023-05-10 ENCOUNTER — Encounter: Payer: Self-pay | Admitting: Family Medicine

## 2023-05-10 ENCOUNTER — Ambulatory Visit: Payer: Medicare Other | Admitting: Family Medicine

## 2023-05-10 VITALS — BP 138/82 | Ht 67.5 in | Wt 260.0 lb

## 2023-05-10 DIAGNOSIS — M65341 Trigger finger, right ring finger: Secondary | ICD-10-CM

## 2023-05-10 DIAGNOSIS — M79644 Pain in right finger(s): Secondary | ICD-10-CM | POA: Diagnosis not present

## 2023-05-10 MED ORDER — METHYLPREDNISOLONE ACETATE 40 MG/ML IJ SUSP
20.0000 mg | Freq: Once | INTRAMUSCULAR | Status: AC
  Administered 2023-05-10: 20 mg via INTRA_ARTICULAR

## 2023-05-10 NOTE — Progress Notes (Addendum)
PCP: Renford Dills, MD  Subjective:   HPI: Patient is a 72 y.o. female here for Pain and locking of her right fourth finger.  Patient states that she was seen for similar symptoms approximately 3 weeks ago and she has been doing band-aid splinting of PIP joint.  Patient notes that the splinting did not help very much.  Patient has locking of the finger whenever she goes into flexion.  Patient states that she has both locking at MCP and sometimes also at the PIP.  Past Medical History:  Diagnosis Date   Allergy    Hypertension    Pre-diabetes    diet controlled   Thyroid disease     Current Outpatient Medications on File Prior to Visit  Medication Sig Dispense Refill   acetaminophen (TYLENOL) 325 MG tablet Take 650 mg by mouth every 6 (six) hours as needed.     amLODipine (NORVASC) 5 MG tablet Take 5 mg by mouth daily.       atorvastatin (LIPITOR) 20 MG tablet      cholecalciferol (VITAMIN D) 1000 units tablet Take 3,000 Units by mouth daily.     conjugated estrogens (PREMARIN) vaginal cream Insert 1/4 applicator vaginally q HS 2wk prior to appt.  Stop using it 2d prior to appt.     COVID-19 mRNA bivalent vaccine, Pfizer, (PFIZER COVID-19 VAC BIVALENT) injection Inject into the muscle. 0.3 mL 0   fluticasone (FLONASE) 50 MCG/ACT nasal spray 2 sprays. PRN     glimepiride (AMARYL) 2 MG tablet      ibuprofen (ADVIL) 400 MG tablet Take 1 tablet (400 mg total) by mouth every 8 (eight) hours as needed for up to 30 doses. 30 tablet 0   ipratropium (ATROVENT) 0.06 % nasal spray Place 2 sprays into both nostrils 4 (four) times daily. As needed for nasal congestion, runny nose 15 mL 0   JARDIANCE 10 MG TABS tablet Take 10 mg by mouth daily.     losartan-hydrochlorothiazide (HYZAAR) 100-25 MG per tablet Take 1 tablet by mouth daily.       SYNTHROID 137 MCG tablet      triamcinolone cream (KENALOG) 0.1 % Apply 1 application topically 2 (two) times daily. 30 g 0   Current Facility-Administered  Medications on File Prior to Visit  Medication Dose Route Frequency Provider Last Rate Last Admin   0.9 %  sodium chloride infusion  500 mL Intravenous Continuous Nandigam, Eleonore Chiquito, MD        Past Surgical History:  Procedure Laterality Date   ABDOMINAL HYSTERECTOMY  1987   1 OVARY REMOVED   COLONOSCOPY  07/20/2006   DB   MYOMECTOMY     30+ yrs ago    THYROID SURGERY  15 YRS AGO   LEFT SIDE   THYROIDECTOMY Right 04/09/2016   Procedure: COMPLETION THYROIDECTOMY;  Surgeon: Avel Peace, MD;  Location: [facility];  Service: General;  Laterality: Right;    Allergies  Allergen Reactions   Sulfa Antibiotics     RASH   Metformin Other (See Comments)   Rosuvastatin Other (See Comments)    myalgia    BP 138/82   Ht 5' 7.5" (1.715 m)   Wt 260 lb (117.9 kg)   BMI 40.12 kg/m       No data to display              No data to display              Objective:  Physical Exam:  Gen: NAD, comfortable in exam room  Right 4th digit: No deformity, swelling, bruising.  Small nodule over A1 pulley. FROM with 5/5 strength flexion and extension at PIP, DIP, MCP joints. Notable locking on flexion.  Tenderness to palpation over A1 pulley. NVI distally.   Assessment & Plan:  1. Trigger ring finger of right hand -- Patient's physical exam is consistent with trigger finger.  Patient's symptoms have been attempted to be corrected with conservative management including topical Voltaren, splinting of the PIP in extension.  Patient has had no improvement.  Patient would benefit from steroid injection today.  Patient is agreeable.  Patient tolerated procedure well without any concerns. Patient advised to f/u in 1 month for reevaluation.   After informed written consent, timeout was performed.  Area overlying right 4th digit A1 pulley prepped with alcohol swabs then utilizing ultrasound guidance, patient's right 4th trigger finger was injected with 0.5:0.50mL lidocaine: depomedrol.  Patient  tolerated procedure well without immediate complications.  Addendum:  Patient seen and examined with fellow.  Agree with his note and findings.  Note edited above.  Present for and supervised trigger finger injection.

## 2023-05-24 ENCOUNTER — Ambulatory Visit: Payer: Medicare Other | Admitting: Family Medicine

## 2023-06-10 ENCOUNTER — Ambulatory Visit (INDEPENDENT_AMBULATORY_CARE_PROVIDER_SITE_OTHER): Payer: Medicare Other | Admitting: Family Medicine

## 2023-06-10 VITALS — BP 118/78 | Ht 67.0 in | Wt 260.0 lb

## 2023-06-10 DIAGNOSIS — M65341 Trigger finger, right ring finger: Secondary | ICD-10-CM | POA: Diagnosis not present

## 2023-06-10 NOTE — Progress Notes (Addendum)
PCP: Renford Dills, MD  Subjective:   HPI: Patient is a 72 y.o. female here for follow-up of trigger ring finger of right hand.  Conservative management with topical Voltaren and splinting of PIP and extension was attempted, but failed. She underwent corticosteroid injection on 05/10/2023.  Today patient reports full function of right ring finger.  No longer having locking of finger with flexion.   Past Medical History:  Diagnosis Date   Allergy    Hypertension    Pre-diabetes    diet controlled   Thyroid disease     Current Outpatient Medications on File Prior to Visit  Medication Sig Dispense Refill   acetaminophen (TYLENOL) 325 MG tablet Take 650 mg by mouth every 6 (six) hours as needed.     amLODipine (NORVASC) 5 MG tablet Take 5 mg by mouth daily.       atorvastatin (LIPITOR) 20 MG tablet      cholecalciferol (VITAMIN D) 1000 units tablet Take 3,000 Units by mouth daily.     conjugated estrogens (PREMARIN) vaginal cream Insert 1/4 applicator vaginally q HS 2wk prior to appt.  Stop using it 2d prior to appt.     COVID-19 mRNA bivalent vaccine, Pfizer, (PFIZER COVID-19 VAC BIVALENT) injection Inject into the muscle. 0.3 mL 0   fluticasone (FLONASE) 50 MCG/ACT nasal spray 2 sprays. PRN     glimepiride (AMARYL) 2 MG tablet      ibuprofen (ADVIL) 400 MG tablet Take 1 tablet (400 mg total) by mouth every 8 (eight) hours as needed for up to 30 doses. 30 tablet 0   ipratropium (ATROVENT) 0.06 % nasal spray Place 2 sprays into both nostrils 4 (four) times daily. As needed for nasal congestion, runny nose 15 mL 0   JARDIANCE 10 MG TABS tablet Take 10 mg by mouth daily.     losartan-hydrochlorothiazide (HYZAAR) 100-25 MG per tablet Take 1 tablet by mouth daily.       SYNTHROID 137 MCG tablet      triamcinolone cream (KENALOG) 0.1 % Apply 1 application topically 2 (two) times daily. 30 g 0   Current Facility-Administered Medications on File Prior to Visit  Medication Dose Route  Frequency Provider Last Rate Last Admin   0.9 %  sodium chloride infusion  500 mL Intravenous Continuous Nandigam, Eleonore Chiquito, MD        Past Surgical History:  Procedure Laterality Date   ABDOMINAL HYSTERECTOMY  1987   1 OVARY REMOVED   COLONOSCOPY  07/20/2006   DB   MYOMECTOMY     30+ yrs ago    THYROID SURGERY  15 YRS AGO   LEFT SIDE   THYROIDECTOMY Right 04/09/2016   Procedure: COMPLETION THYROIDECTOMY;  Surgeon: Avel Peace, MD;  Location: [facility];  Service: General;  Laterality: Right;    Allergies  Allergen Reactions   Sulfa Antibiotics     RASH   Metformin Other (See Comments)   Rosuvastatin Other (See Comments)    myalgia    BP 118/78   Ht 5\' 7"  (1.702 m)   Wt 260 lb (117.9 kg)   BMI 40.72 kg/m       No data to display              No data to display              Objective:  Physical Exam:  Gen: NAD, comfortable in exam room  Right fourth digit: No deformity, swelling, bruising.  Clinically resolved nodule over A1  pulley.  FROM with 5/5 strength flexion and extension at PIP, DIP, MCP joints.  Mild tenderness to palpation over A1 pulley.   Assessment & Plan:  1.  Trigger ring finger of right hand: Clinically improved trigger finger.  Anticipate continued improvement. OTC anti-inflammatories PRN.  No restrictions.  Follow-up as needed.

## 2023-06-11 ENCOUNTER — Encounter: Payer: Self-pay | Admitting: Family Medicine

## 2023-07-21 DIAGNOSIS — Z1231 Encounter for screening mammogram for malignant neoplasm of breast: Secondary | ICD-10-CM | POA: Diagnosis not present

## 2023-08-05 DIAGNOSIS — Z23 Encounter for immunization: Secondary | ICD-10-CM | POA: Diagnosis not present

## 2023-08-23 DIAGNOSIS — E559 Vitamin D deficiency, unspecified: Secondary | ICD-10-CM | POA: Diagnosis not present

## 2023-08-23 DIAGNOSIS — Z0000 Encounter for general adult medical examination without abnormal findings: Secondary | ICD-10-CM | POA: Diagnosis not present

## 2023-08-23 DIAGNOSIS — E7800 Pure hypercholesterolemia, unspecified: Secondary | ICD-10-CM | POA: Diagnosis not present

## 2023-08-23 DIAGNOSIS — E039 Hypothyroidism, unspecified: Secondary | ICD-10-CM | POA: Diagnosis not present

## 2023-08-23 DIAGNOSIS — Z23 Encounter for immunization: Secondary | ICD-10-CM | POA: Diagnosis not present

## 2023-08-23 DIAGNOSIS — E1169 Type 2 diabetes mellitus with other specified complication: Secondary | ICD-10-CM | POA: Diagnosis not present

## 2023-08-23 DIAGNOSIS — I10 Essential (primary) hypertension: Secondary | ICD-10-CM | POA: Diagnosis not present

## 2023-08-23 DIAGNOSIS — Z5181 Encounter for therapeutic drug level monitoring: Secondary | ICD-10-CM | POA: Diagnosis not present

## 2023-08-23 DIAGNOSIS — M6530 Trigger finger, unspecified finger: Secondary | ICD-10-CM | POA: Diagnosis not present

## 2023-10-27 DIAGNOSIS — Z7984 Long term (current) use of oral hypoglycemic drugs: Secondary | ICD-10-CM | POA: Diagnosis not present

## 2023-10-27 DIAGNOSIS — H04123 Dry eye syndrome of bilateral lacrimal glands: Secondary | ICD-10-CM | POA: Diagnosis not present

## 2023-10-27 DIAGNOSIS — H43813 Vitreous degeneration, bilateral: Secondary | ICD-10-CM | POA: Diagnosis not present

## 2023-10-27 DIAGNOSIS — H524 Presbyopia: Secondary | ICD-10-CM | POA: Diagnosis not present

## 2023-10-27 DIAGNOSIS — E119 Type 2 diabetes mellitus without complications: Secondary | ICD-10-CM | POA: Diagnosis not present

## 2023-10-27 DIAGNOSIS — H2513 Age-related nuclear cataract, bilateral: Secondary | ICD-10-CM | POA: Diagnosis not present

## 2023-10-27 DIAGNOSIS — H52203 Unspecified astigmatism, bilateral: Secondary | ICD-10-CM | POA: Diagnosis not present

## 2023-10-27 DIAGNOSIS — H5203 Hypermetropia, bilateral: Secondary | ICD-10-CM | POA: Diagnosis not present

## 2023-10-27 DIAGNOSIS — H25013 Cortical age-related cataract, bilateral: Secondary | ICD-10-CM | POA: Diagnosis not present

## 2023-10-29 DIAGNOSIS — M65341 Trigger finger, right ring finger: Secondary | ICD-10-CM | POA: Diagnosis not present

## 2023-11-09 DIAGNOSIS — N952 Postmenopausal atrophic vaginitis: Secondary | ICD-10-CM | POA: Diagnosis not present

## 2023-11-09 DIAGNOSIS — Z8742 Personal history of other diseases of the female genital tract: Secondary | ICD-10-CM | POA: Diagnosis not present

## 2024-02-28 DIAGNOSIS — I10 Essential (primary) hypertension: Secondary | ICD-10-CM | POA: Diagnosis not present

## 2024-02-28 DIAGNOSIS — E039 Hypothyroidism, unspecified: Secondary | ICD-10-CM | POA: Diagnosis not present

## 2024-02-28 DIAGNOSIS — E7800 Pure hypercholesterolemia, unspecified: Secondary | ICD-10-CM | POA: Diagnosis not present

## 2024-02-28 DIAGNOSIS — E1169 Type 2 diabetes mellitus with other specified complication: Secondary | ICD-10-CM | POA: Diagnosis not present

## 2024-02-28 DIAGNOSIS — Z23 Encounter for immunization: Secondary | ICD-10-CM | POA: Diagnosis not present

## 2024-05-29 DIAGNOSIS — H5203 Hypermetropia, bilateral: Secondary | ICD-10-CM | POA: Diagnosis not present

## 2024-05-29 DIAGNOSIS — H524 Presbyopia: Secondary | ICD-10-CM | POA: Diagnosis not present

## 2024-05-29 DIAGNOSIS — H43813 Vitreous degeneration, bilateral: Secondary | ICD-10-CM | POA: Diagnosis not present

## 2024-05-29 DIAGNOSIS — H2513 Age-related nuclear cataract, bilateral: Secondary | ICD-10-CM | POA: Diagnosis not present

## 2024-05-29 DIAGNOSIS — H52203 Unspecified astigmatism, bilateral: Secondary | ICD-10-CM | POA: Diagnosis not present

## 2024-05-29 DIAGNOSIS — H25013 Cortical age-related cataract, bilateral: Secondary | ICD-10-CM | POA: Diagnosis not present

## 2024-05-29 DIAGNOSIS — H04123 Dry eye syndrome of bilateral lacrimal glands: Secondary | ICD-10-CM | POA: Diagnosis not present

## 2024-06-26 ENCOUNTER — Ambulatory Visit
Admission: EM | Admit: 2024-06-26 | Discharge: 2024-06-26 | Disposition: A | Discharge status: Home / Self Care | Attending: Physician Assistant | Admitting: Physician Assistant

## 2024-06-26 ENCOUNTER — Other Ambulatory Visit: Payer: Self-pay

## 2024-06-26 DIAGNOSIS — L309 Dermatitis, unspecified: Secondary | ICD-10-CM | POA: Diagnosis not present

## 2024-06-26 MED ORDER — TRIAMCINOLONE ACETONIDE 0.1 % EX CREA: 1.0000 | TOPICAL_CREAM | Freq: Two times a day (BID) | CUTANEOUS | 0 refills | Status: AC

## 2024-06-26 NOTE — ED Triage Notes (Signed)
 Pt presents with complaints of rash on left side of neck x 2 weeks. No pain. Only a little itching. OTC antibiotic cream applied to rash with no improvement/relief. Little tenderness to the touch per pt description.

## 2024-06-26 NOTE — Discharge Instructions (Addendum)
 VISIT SUMMARY:  You came in today because of a rash on the left side of your neck that has been present for about two weeks. The rash is not painful or itchy but causes a burning sensation when soap is applied during showers. You have experienced a similar rash in the past and tried using Neosporin without relief.  YOUR PLAN:  -RASH ON LEFT SIDE OF NECK: The rash on your neck resembles eczema, which is a condition that causes the skin to become dry, itchy, and inflamed. It may be due to irritation from dry skin, possibly worsened by hot showers. To treat this, you have been prescribed a topical steroid cream to be applied twice daily for up to two weeks. This will help reduce inflammation and irritation. Additionally, you should use thick, occlusive moisturizers like Aquaphor or Vaseline to keep your skin hydrated. Taking lukewarm showers and using gentle, fragrance-free cleansers will also help minimize further irritation.  INSTRUCTIONS:  Please follow up if the rash does not improve after two weeks of treatment or if it worsens. If you have any questions or concerns, please give us  a call or follow up with your PCP

## 2024-06-26 NOTE — ED Provider Notes (Signed)
 [facility]    CSN: [id number] Arrival date & time: 06/26/24  1401      History   Chief Complaint Chief Complaint  Patient presents with   Rash    HPI Kathryn Reed is a 73 y.o. female.  has a past medical history of Allergy, Hypertension, Pre-diabetes, and Thyroid  disease.   HPI   Discussed the use of AI scribe software for clinical note transcription with the patient, who gave verbal consent to proceed. The patient presents with a rash on the neck.  The rash has been present on the left side of her neck for approximately two weeks. It is not painful or itchy but causes a burning sensation when soap is applied during showers. The rash has remained unchanged since its onset.  She has experienced a similar rash in the same location in the past. She applied Neosporin once without relief, prompting her to seek medical attention.  There is no history of eczema or dry skin on other parts of her body, such as elbows or knees. She uses unscented products. No pain or itchiness associated with the rash.    Past Medical History:  Diagnosis Date   Allergy    Hypertension    Pre-diabetes    diet controlled   Thyroid  disease     Patient Active Problem List   Diagnosis Date Noted   Right knee pain 08/23/2017   Posterior vitreous detachment of both eyes 05/28/2017   Hypermetropia of both eyes 05/28/2017   Diabetes mellitus without complication (HCC) 05/28/2017   Cortical age-related cataract of both eyes 05/28/2017   Nuclear sclerotic cataract of both eyes 11/27/2016   Thyroid  nodule 04/09/2016   Presbyopia 07/19/2014   Low grade squamous intraepithelial lesion (LGSIL) on cervical Pap smear 07/19/2014   Keratoconjunctivitis sicca, not specified as Sjogren's 07/19/2014   Dermatophytosis, nail 07/19/2014   Pain in finger of right hand 08/17/2013   Hypothyroid 01/26/2011   Hypertension 01/26/2011    Past Surgical History:  Procedure Laterality Date    ABDOMINAL HYSTERECTOMY  1987   1 OVARY REMOVED   COLONOSCOPY  07/20/2006   DB   MYOMECTOMY     30+ yrs ago    THYROID  SURGERY  15 YRS AGO   LEFT SIDE   THYROIDECTOMY Right 04/09/2016   Procedure: COMPLETION THYROIDECTOMY;  Surgeon: Krystal Russell, MD;  Location: [facility];  Service: General;  Laterality: Right;    OB History   No obstetric history on file.      Home Medications    Prior to Admission medications   Medication Sig Start Date End Date Taking? Authorizing Provider  triamcinolone  cream (KENALOG ) 0.1 % Apply 1 Application topically 2 (two) times daily. 06/26/24  Yes Devlyn Parish E, PA-C  acetaminophen  (TYLENOL ) 325 MG tablet Take 650 mg by mouth every 6 (six) hours as needed.    [provider]  amLODipine  (NORVASC ) 5 MG tablet Take 5 mg by mouth daily.   11/27/10   [provider]  atorvastatin (LIPITOR) 20 MG tablet  07/04/19   [provider]  cholecalciferol (VITAMIN D) 1000 units tablet Take 3,000 Units by mouth daily.    [provider]  conjugated estrogens (PREMARIN) vaginal cream Insert 1/4 applicator vaginally q HS 2wk prior to appt.  Stop using it 2d prior to appt. 07/05/17   [provider]  COVID-19 mRNA bivalent vaccine, Pfizer, (PFIZER COVID-19 VAC BIVALENT) injection Inject into the muscle. 08/07/21   Luiz Channel, MD  fluticasone (FLONASE) 50 MCG/ACT nasal spray 2 sprays. PRN 12/08/14   [provider]  glimepiride (AMARYL) 2 MG tablet  12/31/17   [provider]  ibuprofen  (ADVIL ) 400 MG tablet Take 1 tablet (400 mg total) by mouth every 8 (eight) hours as needed for up to 30 doses. 10/09/21   Joesph Shaver Scales, PA-C  ipratropium (ATROVENT ) 0.06 % nasal spray Place 2 sprays into both nostrils 4 (four) times daily. As needed for nasal congestion, runny nose 10/09/21   Joesph Shaver Scales, PA-C  JARDIANCE 10 MG TABS tablet Take 10 mg by mouth daily. 04/13/22   [provider]   losartan -hydrochlorothiazide  (HYZAAR) 100-25 MG per tablet Take 1 tablet by mouth daily.   11/27/10   [provider]  SYNTHROID  137 MCG tablet  01/27/18   [provider]    Family History Family History  Problem Relation Age of Onset   Heart attack Mother    Hypertension Mother    Heart attack Father    Hypertension Father    Heart attack Sister    Diabetes Sister    Hypertension Sister    Hyperlipidemia Brother    Sudden death Neg Hx    Colon cancer Neg Hx    Colon polyps Neg Hx    Esophageal cancer Neg Hx    Rectal cancer Neg Hx    Stomach cancer Neg Hx    Breast cancer Neg Hx     Social History Social History   Tobacco Use   Smoking status: Former    Current packs/day: 0.00    Average packs/day: 0.5 packs/day for 10.0 years (5.0 ttl pk-yrs)    Types: Cigarettes    Start date: 10/12/1985    Quit date: 10/13/1995    Years since quitting: 28.7   Smokeless tobacco: Never  Vaping Use   Vaping status: Never Used  Substance Use Topics   Alcohol use: No   Drug use: No     Allergies   Sulfa antibiotics, Empagliflozin, Metformin, and Rosuvastatin   Review of Systems Review of Systems  Skin:  Positive for rash.     Physical Exam Triage Vital Signs ED Triage Vitals  Encounter Vitals Group     BP 06/26/24 1428 130/72     Girls Systolic BP Percentile --      Girls Diastolic BP Percentile --      Boys Systolic BP Percentile --      Boys Diastolic BP Percentile --      Pulse Rate 06/26/24 1428 64     Resp 06/26/24 1428 17     Temp 06/26/24 1428 98.2 F (36.8 C)     Temp Source 06/26/24 1428 Oral     SpO2 06/26/24 1428 97 %     Weight 06/26/24 1428 250 lb (113.4 kg)     Height 06/26/24 1428 5' 7 (1.702 m)     Head Circumference --      Peak Flow --      Pain Score 06/26/24 1444 0     Pain Loc --      Pain Education --      Exclude from Growth Chart --    No data found.  Updated Vital Signs BP 130/72 (BP Location: Right Arm)   Pulse  64   Temp 98.2 F (36.8 C) (Oral)   Resp 17   Ht 5' 7 (1.702 m)   Wt 250 lb (113.4 kg)   SpO2 97%   BMI 39.16 kg/m  Visual Acuity Right Eye Distance:   Left Eye Distance:   Bilateral Distance:    Right Eye Near:   Left Eye Near:    Bilateral Near:     Physical Exam Vitals reviewed.  Constitutional:      General: She is awake.     Appearance: Normal appearance. She is well-developed and well-groomed.  HENT:     Head: Normocephalic and atraumatic.  Eyes:     General: Lids are normal. Gaze aligned appropriately.     Extraocular Movements: Extraocular movements intact.     Conjunctiva/sclera: Conjunctivae normal.  Pulmonary:     Effort: Pulmonary effort is normal.  Skin:    General: Skin is warm and dry.     Findings: Rash present. Rash is macular, papular and scaling.      Neurological:     Mental Status: She is alert and oriented to person, place, and time.  Psychiatric:        Attention and Perception: Attention and perception normal.        Mood and Affect: Mood and affect normal.        Speech: Speech normal.        Behavior: Behavior normal. Behavior is cooperative.      UC Treatments / Results  Labs (all labs ordered are listed, but only abnormal results are displayed) Labs Reviewed - No data to display  EKG   Radiology No results found.  Procedures Procedures (including critical care time)  Medications Ordered in UC Medications - No data to display  Initial Impression / Assessment and Plan / UC Course  I have reviewed the triage vital signs and the nursing notes.  Pertinent labs & imaging results that were available during my care of the patient were reviewed by me and considered in my medical decision making (see chart for details).      Final Clinical Impressions(s) / UC Diagnoses   Final diagnoses:  Eczema, unspecified type   Rash present for two weeks, not painful or itchy, but burns slightly with soap contact. Resembles eczema.  Possible irritation from dry skin exacerbated by hot showers. - Prescribe topical steroid cream to be applied twice daily for up to two weeks to avoid skin thinning. - Recommend using thick, occlusive moisturizers like Aquaphor or Vaseline to improve skin hydration. - Advise taking lukewarm showers to prevent further skin irritation. - Suggest using gentle, fragrance-free cleansers to minimize irritation.     Discharge Instructions      VISIT SUMMARY:  You came in today because of a rash on the left side of your neck that has been present for about two weeks. The rash is not painful or itchy but causes a burning sensation when soap is applied during showers. You have experienced a similar rash in the past and tried using Neosporin without relief.  YOUR PLAN:  -RASH ON LEFT SIDE OF NECK: The rash on your neck resembles eczema, which is a condition that causes the skin to become dry, itchy, and inflamed. It may be due to irritation from dry skin, possibly worsened by hot showers. To treat this, you have been prescribed a topical steroid cream to be applied twice daily for up to two weeks. This will help reduce inflammation and irritation. Additionally, you should use thick, occlusive moisturizers like Aquaphor or Vaseline to keep your skin hydrated. Taking lukewarm showers and using gentle, fragrance-free cleansers will also help minimize further irritation.  INSTRUCTIONS:  Please follow up if the rash does  not improve after two weeks of treatment or if it worsens. If you have any questions or concerns, please give us  a call or follow up with your PCP     ED Prescriptions     Medication Sig Dispense Auth. Provider   triamcinolone  cream (KENALOG ) 0.1 % Apply 1 Application topically 2 (two) times daily. 30 g Brytni Dray E, PA-C      PDMP not reviewed this encounter.   Marylene Rocky BRAVO, PA-C 06/26/24 1538

## 2024-07-24 DIAGNOSIS — Z23 Encounter for immunization: Secondary | ICD-10-CM | POA: Diagnosis not present

## 2024-08-31 DIAGNOSIS — E7800 Pure hypercholesterolemia, unspecified: Secondary | ICD-10-CM | POA: Diagnosis not present

## 2024-08-31 DIAGNOSIS — E1169 Type 2 diabetes mellitus with other specified complication: Secondary | ICD-10-CM | POA: Diagnosis not present

## 2024-08-31 DIAGNOSIS — Z5181 Encounter for therapeutic drug level monitoring: Secondary | ICD-10-CM | POA: Diagnosis not present

## 2024-08-31 DIAGNOSIS — E039 Hypothyroidism, unspecified: Secondary | ICD-10-CM | POA: Diagnosis not present

## 2024-08-31 DIAGNOSIS — Z0000 Encounter for general adult medical examination without abnormal findings: Secondary | ICD-10-CM | POA: Diagnosis not present
